# Patient Record
Sex: Female | Born: 1955 | Race: White | Hispanic: No | Marital: Married | Smoking: Former smoker
Health system: Southern US, Community
[De-identification: ages and names within clinical notes are randomized; demographics above are authoritative.]

## PROBLEM LIST (undated history)

## (undated) DIAGNOSIS — E785 Hyperlipidemia, unspecified: Secondary | ICD-10-CM

## (undated) DIAGNOSIS — M2550 Pain in unspecified joint: Secondary | ICD-10-CM

## (undated) DIAGNOSIS — D351 Benign neoplasm of parathyroid gland: Secondary | ICD-10-CM

## (undated) DIAGNOSIS — T7840XA Allergy, unspecified, initial encounter: Secondary | ICD-10-CM

## (undated) DIAGNOSIS — G4733 Obstructive sleep apnea (adult) (pediatric): Secondary | ICD-10-CM

## (undated) DIAGNOSIS — L509 Urticaria, unspecified: Secondary | ICD-10-CM

## (undated) DIAGNOSIS — E041 Nontoxic single thyroid nodule: Secondary | ICD-10-CM

## (undated) DIAGNOSIS — N179 Acute kidney failure, unspecified: Secondary | ICD-10-CM

## (undated) DIAGNOSIS — D649 Anemia, unspecified: Secondary | ICD-10-CM

## (undated) DIAGNOSIS — E213 Hyperparathyroidism, unspecified: Secondary | ICD-10-CM

## (undated) DIAGNOSIS — K635 Polyp of colon: Secondary | ICD-10-CM

## (undated) DIAGNOSIS — M8580 Other specified disorders of bone density and structure, unspecified site: Secondary | ICD-10-CM

## (undated) DIAGNOSIS — E559 Vitamin D deficiency, unspecified: Secondary | ICD-10-CM

## (undated) DIAGNOSIS — G500 Trigeminal neuralgia: Secondary | ICD-10-CM

## (undated) DIAGNOSIS — M1990 Unspecified osteoarthritis, unspecified site: Secondary | ICD-10-CM

## (undated) DIAGNOSIS — I10 Essential (primary) hypertension: Secondary | ICD-10-CM

## (undated) DIAGNOSIS — G5603 Carpal tunnel syndrome, bilateral upper limbs: Secondary | ICD-10-CM

## (undated) DIAGNOSIS — K219 Gastro-esophageal reflux disease without esophagitis: Secondary | ICD-10-CM

## (undated) DIAGNOSIS — N200 Calculus of kidney: Secondary | ICD-10-CM

## (undated) DIAGNOSIS — N1330 Unspecified hydronephrosis: Secondary | ICD-10-CM

## (undated) HISTORY — DX: Urticaria, unspecified: L50.9

## (undated) HISTORY — DX: Vitamin D deficiency, unspecified: E55.9

## (undated) HISTORY — DX: Polyp of colon: K63.5

## (undated) HISTORY — DX: Hyperparathyroidism, unspecified: E21.3

## (undated) HISTORY — DX: Pain in unspecified joint: M25.50

## (undated) HISTORY — DX: Calculus of kidney: N20.0

## (undated) HISTORY — DX: Allergy, unspecified, initial encounter: T78.40XA

## (undated) HISTORY — DX: Nontoxic single thyroid nodule: E04.1

## (undated) HISTORY — DX: Carpal tunnel syndrome, bilateral upper limbs: G56.03

## (undated) HISTORY — DX: Obstructive sleep apnea (adult) (pediatric): G47.33

## (undated) HISTORY — DX: Unspecified osteoarthritis, unspecified site: M19.90

## (undated) HISTORY — DX: Unspecified hydronephrosis: N13.30

## (undated) HISTORY — DX: Acute kidney failure, unspecified: N17.9

## (undated) HISTORY — DX: Gastro-esophageal reflux disease without esophagitis: K21.9

## (undated) HISTORY — DX: Trigeminal neuralgia: G50.0

## (undated) HISTORY — DX: Hyperlipidemia, unspecified: E78.5

## (undated) HISTORY — DX: Benign neoplasm of parathyroid gland: D35.1

## (undated) HISTORY — DX: Essential (primary) hypertension: I10

## (undated) HISTORY — DX: Other specified disorders of bone density and structure, unspecified site: M85.80

## (undated) HISTORY — DX: Anemia, unspecified: D64.9

## (undated) HISTORY — PX: PARATHYROIDECTOMY: SHX19

---

## 1998-11-18 HISTORY — PX: FACIAL NERVE SURGERY: SHX630

## 2002-03-18 ENCOUNTER — Other Ambulatory Visit: Admission: RE | Admit: 2002-03-18 | Discharge: 2002-03-18 | Payer: Self-pay | Admitting: Family Medicine

## 2004-02-03 ENCOUNTER — Other Ambulatory Visit: Admission: RE | Admit: 2004-02-03 | Discharge: 2004-02-03 | Payer: Self-pay | Admitting: Family Medicine

## 2013-06-29 ENCOUNTER — Encounter: Payer: Self-pay | Admitting: Family Medicine

## 2013-06-29 ENCOUNTER — Ambulatory Visit (INDEPENDENT_AMBULATORY_CARE_PROVIDER_SITE_OTHER): Payer: Self-pay | Admitting: Family Medicine

## 2013-06-29 VITALS — BP 121/82 | HR 88 | Ht 64.5 in | Wt 210.0 lb

## 2013-06-29 DIAGNOSIS — K219 Gastro-esophageal reflux disease without esophagitis: Secondary | ICD-10-CM

## 2013-06-29 DIAGNOSIS — D649 Anemia, unspecified: Secondary | ICD-10-CM

## 2013-06-29 DIAGNOSIS — E785 Hyperlipidemia, unspecified: Secondary | ICD-10-CM

## 2013-06-29 DIAGNOSIS — E876 Hypokalemia: Secondary | ICD-10-CM

## 2013-06-29 DIAGNOSIS — J309 Allergic rhinitis, unspecified: Secondary | ICD-10-CM

## 2013-06-29 DIAGNOSIS — R7301 Impaired fasting glucose: Secondary | ICD-10-CM

## 2013-06-29 DIAGNOSIS — I10 Essential (primary) hypertension: Secondary | ICD-10-CM

## 2013-06-29 DIAGNOSIS — L509 Urticaria, unspecified: Secondary | ICD-10-CM

## 2013-06-29 DIAGNOSIS — R5383 Other fatigue: Secondary | ICD-10-CM

## 2013-06-29 DIAGNOSIS — R5381 Other malaise: Secondary | ICD-10-CM

## 2013-06-29 MED ORDER — POTASSIUM CHLORIDE ER 10 MEQ PO TBCR: 10.0000 meq | EXTENDED_RELEASE_TABLET | Freq: Two times a day (BID) | ORAL | Status: DC

## 2013-06-29 MED ORDER — LOSARTAN POTASSIUM-HCTZ 100-25 MG PO TABS: 1.0000 | ORAL_TABLET | Freq: Every day | ORAL | Status: DC

## 2013-06-29 MED ORDER — PANTOPRAZOLE SODIUM 40 MG PO TBEC: 40.0000 mg | DELAYED_RELEASE_TABLET | Freq: Every day | ORAL | Status: DC

## 2013-06-29 NOTE — Progress Notes (Signed)
  Subjective:    Patient ID: Andrea Melton, female    DOB: Oct 27, 1956, 57 y.o.   MRN: [id number]  HPI  Andrea Melton is here today to establish care with our practice.  She previously received her care from Dr. Almedia Balls with [facility].  She did not feel confident in the care she was receiving and decided she wanted a new provider.  She has run out of her medications and needs refills.  She would like them to be sent to her mail order pharmacy with a 90 day supply.      Review of Systems  Constitutional: Negative.   HENT: Negative.   Eyes: Negative.   Respiratory: Negative.   Cardiovascular: Negative.   Gastrointestinal: Negative.   Endocrine: Negative.   Genitourinary: Negative.   Musculoskeletal: Negative.   Skin: Negative.   Allergic/Immunologic: Negative.   Neurological: Negative.   Hematological: Negative.   Psychiatric/Behavioral: Negative.     Past Medical History  Diagnosis Date  . Hypertension   . Allergy   . GERD (gastroesophageal reflux disease)   . Joint pain   . Trigeminal neuralgia     Family History  Problem Relation Age of Onset  . Hypertension Mother   . Stroke Mother   . Heart disease Father   . Hypertension Father   . Stroke Father   . Depression Maternal Grandmother   . Hypertension Brother   . Kidney disease Brother   . Cancer Sister   . Hypertension Sister   . Kidney disease Sister   . Cancer Sister   . 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    Alcohol Use:  Twice weekly (2 glasses of wine)    Drug Use:  None   Diet:  Regular   Exercise:  Cardio and  weights three times weekly   Hobbies:  Sewing, Reading and gardening                     Objective:   Physical Exam  Vitals reviewed. Constitutional: She appears well-nourished. No distress.  HENT:  Head: Normocephalic.  Eyes: No scleral icterus.  Neck: No thyromegaly present.  Cardiovascular: Normal rate, regular rhythm and normal heart sounds.   Pulmonary/Chest: Effort normal and breath sounds normal.  Abdominal: There is no tenderness.  Musculoskeletal: She exhibits no edema and no tenderness.  Neurological: She is alert.  Skin: Skin is warm and dry.  Psychiatric: She has a normal mood and affect. Her behavior is normal. Judgment and thought content normal.          Assessment & Plan:

## 2013-06-29 NOTE — Patient Instructions (Addendum)
Preventive Care for Adults, Female A healthy lifestyle and preventive care can promote health and wellness. Preventive health guidelines for women include the following key practices.  A routine yearly physical is a good way to check with your caregiver about your health and preventive screening. It is a chance to share any concerns and updates on your health, and to receive a thorough exam.  Visit your dentist for a routine exam and preventive care every 6 months. Brush your teeth twice a day and floss once a day. Good oral hygiene prevents tooth decay and gum disease.  The frequency of eye exams is based on your age, health, family medical history, use of contact lenses, and other factors. Follow your caregiver's recommendations for frequency of eye exams.  Eat a healthy diet. Foods like vegetables, fruits, whole grains, low-fat dairy products, and lean protein foods contain the nutrients you need without too many calories. Decrease your intake of foods high in solid fats, added sugars, and salt. Eat the right amount of calories for you.Get information about a proper diet from your caregiver, if necessary.  Regular physical exercise is one of the most important things you can do for your health. Most adults should get at least 150 minutes of moderate-intensity exercise (any activity that increases your heart rate and causes you to sweat) each week. In addition, most adults need muscle-strengthening exercises on 2 or more days a week.  Maintain a healthy weight. The body mass index (BMI) is a screening tool to identify possible weight problems. It provides an estimate of body fat based on height and weight. Your caregiver can help determine your BMI, and can help you achieve or maintain a healthy weight.For adults 20 years and older:  A BMI below 18.5 is considered underweight.  A BMI of 18.5 to 24.9 is normal.  A BMI of 25 to 29.9 is considered overweight.  A BMI of 30 and above is  considered obese.  Maintain normal blood lipids and cholesterol levels by exercising and minimizing your intake of saturated fat. Eat a balanced diet with plenty of fruit and vegetables. Blood tests for lipids and cholesterol should begin at age 20 and be repeated every 5 years. If your lipid or cholesterol levels are high, you are over 50, or you are at high risk for heart disease, you may need your cholesterol levels checked more frequently.Ongoing high lipid and cholesterol levels should be treated with medicines if diet and exercise are not effective.  If you smoke, find out from your caregiver how to quit. If you do not use tobacco, do not start.  If you are pregnant, do not drink alcohol. If you are breastfeeding, be very cautious about drinking alcohol. If you are not pregnant and choose to drink alcohol, do not exceed 1 drink per day. One drink is considered to be 12 ounces (355 mL) of beer, 5 ounces (148 mL) of wine, or 1.5 ounces (44 mL) of liquor.  Avoid use of street drugs. Do not share needles with anyone. Ask for help if you need support or instructions about stopping the use of drugs.  High blood pressure causes heart disease and increases the risk of stroke. Your blood pressure should be checked at least every 1 to 2 years. Ongoing high blood pressure should be treated with medicines if weight loss and exercise are not effective.  If you are 55 to 59 years old, ask your caregiver if you should take aspirin to prevent strokes.  Diabetes   screening involves taking a blood sample to check your fasting blood sugar level. This should be done once every 3 years, after age 45, if you are within normal weight and without risk factors for diabetes. Testing should be considered at a younger age or be carried out more frequently if you are overweight and have at least 1 risk factor for diabetes.  Breast cancer screening is essential preventive care for women. You should practice "breast  self-awareness." This means understanding the normal appearance and feel of your breasts and may include breast self-examination. Any changes detected, no matter how small, should be reported to a caregiver. Women in their 20s and 30s should have a clinical breast exam (CBE) by a caregiver as part of a regular health exam every 1 to 3 years. After age 40, women should have a CBE every year. Starting at age 40, women should consider having a mammography (breast X-ray test) every year. Women who have a family history of breast cancer should talk to their caregiver about genetic screening. Women at a high risk of breast cancer should talk to their caregivers about having magnetic resonance imaging (MRI) and a mammography every year.  The Pap test is a screening test for cervical cancer. A Pap test can show cell changes on the cervix that might become cervical cancer if left untreated. A Pap test is a procedure in which cells are obtained and examined from the lower end of the uterus (cervix).  Women should have a Pap test starting at age 21.  Between ages 21 and 29, Pap tests should be repeated every 2 years.  Beginning at age 30, you should have a Pap test every 3 years as long as the past 3 Pap tests have been normal.  Some women have medical problems that increase the chance of getting cervical cancer. Talk to your caregiver about these problems. It is especially important to talk to your caregiver if a new problem develops soon after your last Pap test. In these cases, your caregiver may recommend more frequent screening and Pap tests.  The above recommendations are the same for women who have or have not gotten the vaccine for human papillomavirus (HPV).  If you had a hysterectomy for a problem that was not cancer or a condition that could lead to cancer, then you no longer need Pap tests. Even if you no longer need a Pap test, a regular exam is a good idea to make sure no other problems are  starting.  If you are between ages 65 and 70, and you have had normal Pap tests going back 10 years, you no longer need Pap tests. Even if you no longer need a Pap test, a regular exam is a good idea to make sure no other problems are starting.  If you have had past treatment for cervical cancer or a condition that could lead to cancer, you need Pap tests and screening for cancer for at least 20 years after your treatment.  If Pap tests have been discontinued, risk factors (such as a new sexual partner) need to be reassessed to determine if screening should be resumed.  The HPV test is an additional test that may be used for cervical cancer screening. The HPV test looks for the virus that can cause the cell changes on the cervix. The cells collected during the Pap test can be tested for HPV. The HPV test could be used to screen women aged 30 years and older, and should   be used in women of any age who have unclear Pap test results. After the age of 30, women should have HPV testing at the same frequency as a Pap test.  Colorectal cancer can be detected and often prevented. Most routine colorectal cancer screening begins at the age of 50 and continues through age 75. However, your caregiver may recommend screening at an earlier age if you have risk factors for colon cancer. On a yearly basis, your caregiver may provide home test kits to check for hidden blood in the stool. Use of a small camera at the end of a tube, to directly examine the colon (sigmoidoscopy or colonoscopy), can detect the earliest forms of colorectal cancer. Talk to your caregiver about this at age 50, when routine screening begins. Direct examination of the colon should be repeated every 5 to 10 years through age 75, unless early forms of pre-cancerous polyps or small growths are found.  Hepatitis C blood testing is recommended for all people born from 1945 through 1965 and any individual with known risks for hepatitis C.  Practice  safe sex. Use condoms and avoid high-risk sexual practices to reduce the spread of sexually transmitted infections (STIs). STIs include gonorrhea, chlamydia, syphilis, trichomonas, herpes, HPV, and human immunodeficiency virus (HIV). Herpes, HIV, and HPV are viral illnesses that have no cure. They can result in disability, cancer, and death. Sexually active women aged 25 and younger should be checked for chlamydia. Older women with new or multiple partners should also be tested for chlamydia. Testing for other STIs is recommended if you are sexually active and at increased risk.  Osteoporosis is a disease in which the bones lose minerals and strength with aging. This can result in serious bone fractures. The risk of osteoporosis can be identified using a bone density scan. Women ages 65 and over and women at risk for fractures or osteoporosis should discuss screening with their caregivers. Ask your caregiver whether you should take a calcium supplement or vitamin D to reduce the rate of osteoporosis.  Menopause can be associated with physical symptoms and risks. Hormone replacement therapy is available to decrease symptoms and risks. You should talk to your caregiver about whether hormone replacement therapy is right for you.  Use sunscreen with sun protection factor (SPF) of 30 or more. Apply sunscreen liberally and repeatedly throughout the day. You should seek shade when your shadow is shorter than you. Protect yourself by wearing long sleeves, pants, a wide-brimmed hat, and sunglasses year round, whenever you are outdoors.  Once a month, do a whole body skin exam, using a mirror to look at the skin on your back. Notify your caregiver of new moles, moles that have irregular borders, moles that are larger than a pencil eraser, or moles that have changed in shape or color.  Stay current with required immunizations.  Influenza. You need a dose every fall (or winter). The composition of the flu vaccine  changes each year, so being vaccinated once is not enough.  Pneumococcal polysaccharide. You need 1 to 2 doses if you smoke cigarettes or if you have certain chronic medical conditions. You need 1 dose at age 65 (or older) if you have never been vaccinated.  Tetanus, diphtheria, pertussis (Tdap, Td). Get 1 dose of Tdap vaccine if you are younger than age 65, are over 65 and have contact with an infant, are a healthcare worker, are pregnant, or simply want to be protected from whooping cough. After that, you need a Td   booster dose every 10 years. Consult your caregiver if you have not had at least 3 tetanus and diphtheria-containing shots sometime in your life or have a deep or dirty wound.  HPV. You need this vaccine if you are a woman age 26 or younger. The vaccine is given in 3 doses over 6 months.  Measles, mumps, rubella (MMR). You need at least 1 dose of MMR if you were born in 1957 or later. You may also need a second dose.  Meningococcal. If you are age 19 to 21 and a first-year college student living in a residence hall, or have one of several medical conditions, you need to get vaccinated against meningococcal disease. You may also need additional booster doses.  Zoster (shingles). If you are age 60 or older, you should get this vaccine.  Varicella (chickenpox). If you have never had chickenpox or you were vaccinated but received only 1 dose, talk to your caregiver to find out if you need this vaccine.  Hepatitis A. You need this vaccine if you have a specific risk factor for hepatitis A virus infection or you simply wish to be protected from this disease. The vaccine is usually given as 2 doses, 6 to 18 months apart.  Hepatitis B. You need this vaccine if you have a specific risk factor for hepatitis B virus infection or you simply wish to be protected from this disease. The vaccine is given in 3 doses, usually over 6 months. Preventive Services / Frequency Ages 19 to 39  Blood  pressure check.** / Every 1 to 2 years.  Lipid and cholesterol check.** / Every 5 years beginning at age 20.  Clinical breast exam.** / Every 3 years for women in their 20s and 30s.  Pap test.** / Every 2 years from ages 21 through 29. Every 3 years starting at age 30 through age 65 or 70 with a history of 3 consecutive normal Pap tests.  HPV screening.** / Every 3 years from ages 30 through ages 65 to 70 with a history of 3 consecutive normal Pap tests.  Hepatitis C blood test.** / For any individual with known risks for hepatitis C.  Skin self-exam. / Monthly.  Influenza immunization.** / Every year.  Pneumococcal polysaccharide immunization.** / 1 to 2 doses if you smoke cigarettes or if you have certain chronic medical conditions.  Tetanus, diphtheria, pertussis (Tdap, Td) immunization. / A one-time dose of Tdap vaccine. After that, you need a Td booster dose every 10 years.  HPV immunization. / 3 doses over 6 months, if you are 26 and younger.  Measles, mumps, rubella (MMR) immunization. / You need at least 1 dose of MMR if you were born in 1957 or later. You may also need a second dose.  Meningococcal immunization. / 1 dose if you are age 19 to 21 and a first-year college student living in a residence hall, or have one of several medical conditions, you need to get vaccinated against meningococcal disease. You may also need additional booster doses.  Varicella immunization.** / Consult your caregiver.  Hepatitis A immunization.** / Consult your caregiver. 2 doses, 6 to 18 months apart.  Hepatitis B immunization.** / Consult your caregiver. 3 doses usually over 6 months. Ages 40 to 64  Blood pressure check.** / Every 1 to 2 years.  Lipid and cholesterol check.** / Every 5 years beginning at age 20.  Clinical breast exam.** / Every year after age 40.  Mammogram.** / Every year beginning at age 40   and continuing for as long as you are in good health. Consult with your  caregiver.  Pap test.** / Every 3 years starting at age 30 through age 65 or 70 with a history of 3 consecutive normal Pap tests.  HPV screening.** / Every 3 years from ages 30 through ages 65 to 70 with a history of 3 consecutive normal Pap tests.  Fecal occult blood test (FOBT) of stool. / Every year beginning at age 50 and continuing until age 75. You may not need to do this test if you get a colonoscopy every 10 years.  Flexible sigmoidoscopy or colonoscopy.** / Every 5 years for a flexible sigmoidoscopy or every 10 years for a colonoscopy beginning at age 50 and continuing until age 75.  Hepatitis C blood test.** / For all people born from 1945 through 1965 and any individual with known risks for hepatitis C.  Skin self-exam. / Monthly.  Influenza immunization.** / Every year.  Pneumococcal polysaccharide immunization.** / 1 to 2 doses if you smoke cigarettes or if you have certain chronic medical conditions.  Tetanus, diphtheria, pertussis (Tdap, Td) immunization.** / A one-time dose of Tdap vaccine. After that, you need a Td booster dose every 10 years.  Measles, mumps, rubella (MMR) immunization. / You need at least 1 dose of MMR if you were born in 1957 or later. You may also need a second dose.  Varicella immunization.** / Consult your caregiver.  Meningococcal immunization.** / Consult your caregiver.  Hepatitis A immunization.** / Consult your caregiver. 2 doses, 6 to 18 months apart.  Hepatitis B immunization.** / Consult your caregiver. 3 doses, usually over 6 months. Ages 65 and over  Blood pressure check.** / Every 1 to 2 years.  Lipid and cholesterol check.** / Every 5 years beginning at age 20.  Clinical breast exam.** / Every year after age 40.  Mammogram.** / Every year beginning at age 40 and continuing for as long as you are in good health. Consult with your caregiver.  Pap test.** / Every 3 years starting at age 30 through age 65 or 70 with a 3  consecutive normal Pap tests. Testing can be stopped between 65 and 70 with 3 consecutive normal Pap tests and no abnormal Pap or HPV tests in the past 10 years.  HPV screening.** / Every 3 years from ages 30 through ages 65 or 70 with a history of 3 consecutive normal Pap tests. Testing can be stopped between 65 and 70 with 3 consecutive normal Pap tests and no abnormal Pap or HPV tests in the past 10 years.  Fecal occult blood test (FOBT) of stool. / Every year beginning at age 50 and continuing until age 75. You may not need to do this test if you get a colonoscopy every 10 years.  Flexible sigmoidoscopy or colonoscopy.** / Every 5 years for a flexible sigmoidoscopy or every 10 years for a colonoscopy beginning at age 50 and continuing until age 75.  Hepatitis C blood test.** / For all people born from 1945 through 1965 and any individual with known risks for hepatitis C.  Osteoporosis screening.** / A one-time screening for women ages 65 and over and women at risk for fractures or osteoporosis.  Skin self-exam. / Monthly.  Influenza immunization.** / Every year.  Pneumococcal polysaccharide immunization.** / 1 dose at age 65 (or older) if you have never been vaccinated.  Tetanus, diphtheria, pertussis (Tdap, Td) immunization. / A one-time dose of Tdap vaccine if you are over   65 and have contact with an infant, are a healthcare worker, or simply want to be protected from whooping cough. After that, you need a Td booster dose every 10 years.  Varicella immunization.** / Consult your caregiver.  Meningococcal immunization.** / Consult your caregiver.  Hepatitis A immunization.** / Consult your caregiver. 2 doses, 6 to 18 months apart.  Hepatitis B immunization.** / Check with your caregiver. 3 doses, usually over 6 months. ** Family history and personal history of risk and conditions may change your caregiver's recommendations. Document Released: 12/31/2001 Document Revised: 01/27/2012  Document Reviewed: 04/01/2011 [employer] Patient Information 2014 [employer].  

## 2013-07-07 LAB — CBC WITH DIFFERENTIAL/PLATELET
Basophils Absolute: 0 10*3/uL (ref 0.0–0.1)
Basophils Relative: 1 % (ref 0–1)
Eosinophils Absolute: 0.1 10*3/uL (ref 0.0–0.7)
Eosinophils Relative: 3 % (ref 0–5)
HCT: 37.4 % (ref 36.0–46.0)
Hemoglobin: 12.8 g/dL (ref 12.0–15.0)
Lymphocytes Relative: 29 % (ref 12–46)
Lymphs Abs: 1.5 10*3/uL (ref 0.7–4.0)
MCH: 27.7 pg (ref 26.0–34.0)
MCHC: 34.2 g/dL (ref 30.0–36.0)
MCV: 81 fL (ref 78.0–100.0)
Monocytes Absolute: 0.3 10*3/uL (ref 0.1–1.0)
Monocytes Relative: 7 % (ref 3–12)
Neutro Abs: 3 10*3/uL (ref 1.7–7.7)
Neutrophils Relative %: 60 % (ref 43–77)
Platelets: 284 10*3/uL (ref 150–400)
RBC: 4.62 MIL/uL (ref 3.87–5.11)
RDW: 13.7 % (ref 11.5–15.5)
WBC: 5 10*3/uL (ref 4.0–10.5)

## 2013-07-07 LAB — LIPID PANEL
Cholesterol: 226 mg/dL — ABNORMAL HIGH (ref 0–200)
HDL: 57 mg/dL (ref >39)
LDL Cholesterol: 152 mg/dL — ABNORMAL HIGH (ref 0–99)
Total CHOL/HDL Ratio: 4 Ratio
Triglycerides: 86 mg/dL (ref <150)
VLDL: 17 mg/dL (ref 0–40)

## 2013-07-07 LAB — COMPLETE METABOLIC PANEL WITH GFR
ALT: 35 U/L (ref 0–35)
AST: 23 U/L (ref 0–37)
Albumin: 4.1 g/dL (ref 3.5–5.2)
Alkaline Phosphatase: 76 U/L (ref 39–117)
BUN: 19 mg/dL (ref 6–23)
CO2: 24 mEq/L (ref 19–32)
Calcium: 10.1 mg/dL (ref 8.4–10.5)
Chloride: 103 mEq/L (ref 96–112)
Creat: 0.89 mg/dL (ref 0.50–1.10)
GFR, Est African American: 82 mL/min
GFR, Est Non African American: 71 mL/min
Glucose, Bld: 113 mg/dL — ABNORMAL HIGH (ref 70–99)
Potassium: 3.6 mEq/L (ref 3.5–5.3)
Sodium: 137 mEq/L (ref 135–145)
Total Bilirubin: 0.9 mg/dL (ref 0.3–1.2)
Total Protein: 6.8 g/dL (ref 6.0–8.3)

## 2013-07-07 LAB — TSH: TSH: 2.667 u[IU]/mL (ref 0.350–4.500)

## 2013-07-08 LAB — INSULIN, RANDOM: Insulin: 27 u[IU]/mL (ref 3–28)

## 2013-07-09 LAB — INSULIN, FASTING: Insulin fasting, serum: 26 u[IU]/mL (ref 3–28)

## 2013-07-11 ENCOUNTER — Encounter: Payer: Self-pay | Admitting: Family Medicine

## 2013-07-11 DIAGNOSIS — J309 Allergic rhinitis, unspecified: Secondary | ICD-10-CM | POA: Insufficient documentation

## 2013-07-11 DIAGNOSIS — E876 Hypokalemia: Secondary | ICD-10-CM | POA: Insufficient documentation

## 2013-07-11 DIAGNOSIS — K2270 Barrett's esophagus without dysplasia: Secondary | ICD-10-CM | POA: Insufficient documentation

## 2013-07-11 DIAGNOSIS — K219 Gastro-esophageal reflux disease without esophagitis: Secondary | ICD-10-CM | POA: Insufficient documentation

## 2013-07-11 DIAGNOSIS — E785 Hyperlipidemia, unspecified: Secondary | ICD-10-CM | POA: Insufficient documentation

## 2013-07-11 DIAGNOSIS — I10 Essential (primary) hypertension: Secondary | ICD-10-CM | POA: Insufficient documentation

## 2013-07-11 DIAGNOSIS — R7301 Impaired fasting glucose: Secondary | ICD-10-CM | POA: Insufficient documentation

## 2013-07-11 DIAGNOSIS — L509 Urticaria, unspecified: Secondary | ICD-10-CM | POA: Insufficient documentation

## 2013-07-11 DIAGNOSIS — R5381 Other malaise: Secondary | ICD-10-CM | POA: Insufficient documentation

## 2013-07-11 DIAGNOSIS — D649 Anemia, unspecified: Secondary | ICD-10-CM | POA: Insufficient documentation

## 2013-07-11 NOTE — Assessment & Plan Note (Signed)
Refilled her potassium.   

## 2013-07-11 NOTE — Assessment & Plan Note (Signed)
Checking a fasting insulin level.

## 2013-07-11 NOTE — Assessment & Plan Note (Signed)
Checking a lipid panel.  

## 2013-07-11 NOTE — Assessment & Plan Note (Signed)
This condition is controlled with Periactin.

## 2013-07-11 NOTE — Assessment & Plan Note (Signed)
She will the compare the effectiveness vs cost of omeprazole.

## 2013-07-11 NOTE — Assessment & Plan Note (Signed)
Checking a TSH.   

## 2013-07-11 NOTE — Assessment & Plan Note (Addendum)
Her symptoms are usually controlled with Periactin so this was refilled.

## 2013-07-11 NOTE — Assessment & Plan Note (Signed)
Checking a CBC 

## 2013-07-11 NOTE — Assessment & Plan Note (Signed)
Refilled her losartan/HCT at her current dosage.

## 2013-08-03 ENCOUNTER — Encounter: Payer: Self-pay | Admitting: Family Medicine

## 2013-08-03 ENCOUNTER — Ambulatory Visit (INDEPENDENT_AMBULATORY_CARE_PROVIDER_SITE_OTHER): Payer: BC Managed Care – PPO | Admitting: Family Medicine

## 2013-08-03 ENCOUNTER — Other Ambulatory Visit (HOSPITAL_COMMUNITY)
Admission: RE | Admit: 2013-08-03 | Discharge: 2013-08-03 | Disposition: A | Discharge status: Home / Self Care | Payer: BC Managed Care – PPO | Source: Ambulatory Visit | Attending: Family Medicine | Admitting: Family Medicine

## 2013-08-03 VITALS — BP 130/87 | HR 93 | Ht 64.5 in | Wt 207.0 lb

## 2013-08-03 DIAGNOSIS — L509 Urticaria, unspecified: Secondary | ICD-10-CM

## 2013-08-03 DIAGNOSIS — Z0000 Encounter for general adult medical examination without abnormal findings: Secondary | ICD-10-CM

## 2013-08-03 DIAGNOSIS — N390 Urinary tract infection, site not specified: Secondary | ICD-10-CM

## 2013-08-03 DIAGNOSIS — Z124 Encounter for screening for malignant neoplasm of cervix: Secondary | ICD-10-CM

## 2013-08-03 DIAGNOSIS — G500 Trigeminal neuralgia: Secondary | ICD-10-CM

## 2013-08-03 DIAGNOSIS — R7301 Impaired fasting glucose: Secondary | ICD-10-CM

## 2013-08-03 DIAGNOSIS — Z1151 Encounter for screening for human papillomavirus (HPV): Secondary | ICD-10-CM | POA: Insufficient documentation

## 2013-08-03 DIAGNOSIS — R8290 Unspecified abnormal findings in urine: Secondary | ICD-10-CM

## 2013-08-03 DIAGNOSIS — E785 Hyperlipidemia, unspecified: Secondary | ICD-10-CM

## 2013-08-03 DIAGNOSIS — M25559 Pain in unspecified hip: Secondary | ICD-10-CM

## 2013-08-03 DIAGNOSIS — Z23 Encounter for immunization: Secondary | ICD-10-CM

## 2013-08-03 DIAGNOSIS — Z01419 Encounter for gynecological examination (general) (routine) without abnormal findings: Secondary | ICD-10-CM | POA: Insufficient documentation

## 2013-08-03 DIAGNOSIS — K219 Gastro-esophageal reflux disease without esophagitis: Secondary | ICD-10-CM

## 2013-08-03 DIAGNOSIS — R82998 Other abnormal findings in urine: Secondary | ICD-10-CM

## 2013-08-03 LAB — POCT URINALYSIS DIPSTICK
Bilirubin, UA: NEGATIVE
Blood, UA: NEGATIVE
Glucose, UA: NEGATIVE
Ketones, UA: NEGATIVE
Leukocytes, UA: NEGATIVE
Nitrite, UA: POSITIVE
Protein, UA: NEGATIVE
Spec Grav, UA: 1.02
Urobilinogen, UA: NEGATIVE
pH, UA: 7

## 2013-08-03 MED ORDER — PREGABALIN 75 MG PO CAPS: 75.0000 mg | ORAL_CAPSULE | Freq: Two times a day (BID) | ORAL | Status: DC

## 2013-08-03 MED ORDER — PRAVASTATIN SODIUM 20 MG PO TABS: 20.0000 mg | ORAL_TABLET | Freq: Every evening | ORAL | Status: DC

## 2013-08-03 MED ORDER — RANITIDINE HCL 150 MG PO TABS: ORAL_TABLET | ORAL | Status: DC

## 2013-08-03 NOTE — Progress Notes (Signed)
Subjective:    Patient ID: Andrea Melton, female    DOB: 1955/12/29, 57 y.o.   MRN: [id number]  HPI  Andrea Melton is here today for her annual CPE/PAP.  She also wants to discuss the conditions listed below:      1)  Hives: She woke up with another hives episode this morning.  She has not taken her Periactin today to avoid drowsiness.   2)  Neuralgia:  She has been experiencing a mild "neuralgia" pain in her head.  She has a similar pain about 14 years ago.      Review of Systems  Constitutional: Negative.   HENT: Negative.   Eyes: Negative.   Respiratory: Negative.   Cardiovascular: Negative.   Gastrointestinal: Negative.   Endocrine: Negative.   Genitourinary: Negative.   Musculoskeletal: Positive for arthralgias (Hip Pain ).  Skin: Positive for rash.  Allergic/Immunologic: Negative.   Neurological:       Pain in head (Trigeminal)   Hematological: Negative.   Psychiatric/Behavioral: Negative.      Past Medical History  Diagnosis Date  . Hypertension   . Allergy   . GERD (gastroesophageal reflux disease)   . Joint pain   . Trigeminal neuralgia   . Urticaria      Family History  Problem Relation Age of Onset  . Hypertension Mother   . Stroke Mother   . Heart disease Father   . Hypertension Father   . Stroke Father   . Depression Maternal Grandmother   . Hypertension Brother   . Kidney disease Brother   . Cancer Sister   . Hypertension Sister   . Kidney disease Sister   . Cancer Sister   . 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    Alcohol Use:  Twice weekly (2 glasses of wine)     Drug Use:  None   Diet:  Regular   Exercise:  Cardio and weights three times weekly   Hobbies:  Sewing, Reading and gardening                    Objective:   Physical Exam  Vitals reviewed. Constitutional: She is oriented to person, place, and time. She appears well-developed and well-nourished.  HENT:  Head: Normocephalic and atraumatic.  Right Ear: External ear normal.  Left Ear: External ear normal.  Nose: Nose normal.  Mouth/Throat: Oropharynx is clear and moist.  Eyes: Conjunctivae and EOM are normal. Pupils are equal, round, and reactive to light.  Neck: Normal range of motion. No thyromegaly present.  Cardiovascular: Normal rate, regular rhythm, normal heart sounds and intact distal pulses.  Exam reveals no gallop and no friction rub.   No murmur heard. Pulmonary/Chest: Effort normal and breath sounds normal. Right breast exhibits no inverted nipple, no mass, no nipple discharge, no skin change and no tenderness. Left breast exhibits no inverted nipple, no mass, no nipple discharge, no skin change and no tenderness. Breasts are symmetrical.  Abdominal: Soft. Bowel sounds are normal. Hernia confirmed negative in the right inguinal area and  confirmed negative in the left inguinal area.  Genitourinary: Vagina normal and uterus normal. Pelvic exam was performed with patient supine. There is no rash, tenderness or lesion on the right labia. There is no rash, tenderness or lesion on the left labia. No vaginal discharge found.  Musculoskeletal: Normal range of motion. She exhibits no edema and no tenderness.  Lymphadenopathy:    She has no cervical adenopathy.       Right: No inguinal adenopathy present.       Left: No inguinal adenopathy present.  Neurological: She is alert and oriented to person, place, and time. She has normal reflexes.  Skin: Skin is warm and dry.  Psychiatric: She has a normal mood and affect. Her behavior is normal. Judgment and thought content normal.       Assessment & Plan:

## 2013-08-03 NOTE — Patient Instructions (Addendum)
1)  Preventative Care - Vaccinations (Tdap/Zostavax); Mammogram; Bone Density, Stool Cards   2)  Blood Sugar - Decrease carbs and increase exercise.   3)  Cholesterol - Consider taking the pravastatin 40 mg at night.    4)  Stroke Prevention - Baby ASA, Diet high in foods with Folic Acid; BP good control and Statin   5)  GERD/Stomach - Add 150 mg - 300 mg of ranitidine plus Mylanta plus 1 tsp Baking Soda in 8 oz water and Tums   6)  Hip/Knee Pain - ? X-ray ([facility] vs [facility])   7)  Pain - Lyrica 75 mg at night - you can go to BID.     Insulin Resistance Blood sugar (glucose) levels are controlled by a hormone called insulin. Insulin is made by your pancreas. When your blood glucose goes up, insulin is released into your blood. Insulin is required for your body to function normally. However, your body can become resistant to your own insulin or to insulin given to treat diabetes. In either case, insulin resistance can lead to serious problems. These problems include:  Type 2 diabetes.  Heart disease.  High blood pressure.  Stroke.  Polycystic ovary syndrome.  Fatty liver. CAUSES  Insulin resistance can develop for many different reasons. It is more likely to happen in people with these conditions or characteristics:  Obesity.  Inactivity.  Pregnancy.  High blood pressure.  Stress.  Steroid use.  Infection or severe illness.  Increased levels of cholesterol and triglycerides. SYMPTOMS  There are no symptoms. You may have symptoms related to the various complications of insulin resistance.  DIAGNOSIS  Several different things can make your caregiver suspect you have insulin resistance. These include:  High blood glucose (hyperglycemia).  Abnormal cholesterol levels.  High uric acid levels.  Changes related to blood pressure.  Changes related to inflammation. Insulin resistance can be determined with blood tests. An elevated insulin level when you have not  eaten might suggest resistance. Other more complicated tests are sometimes necessary. TREATMENT  Lifestyle changes are the most important treatment for insulin resistance.   If you are overweight and you have insulin resistance, you can improve your insulin sensitivity by losing weight.  Moderate exercise for 30 40 minutes, 4 days a week, can improve insulin sensitivity. Some medicines can also help improve your insulin sensitivity. Your caregiver can discuss these with you if they are appropriate.  HOME CARE INSTRUCTIONS   Do not smoke.  Keep your weight at a healthy level.  Get exercise.  If you have diabetes, follow your caregiver's directions.  If you have high blood pressure, follow your caregiver's directions.  Only take prescription medicines for pain, fever, or discomfort as directed by your caregiver. SEEK MEDICAL CARE IF:   You are diabetic and you are having problems keeping your blood glucose levels at target range.  You are having episodes of low blood glucose (hypoglycemia).  You feel you might be having side effects from your medicines.  You have symptoms of an illness that is not improving after 3 4 days.  You have a sore or wound that is not healing.  You notice a change in vision or a new problem with your vision. SEEK IMMEDIATE MEDICAL CARE IF:   Your blood glucose goes below 70, especially if you have confusion, lightheadedness, or other symptoms with it.  Your blood glucose is very high (as advised by your caregiver) twice in a row.  You pass out.  You  have chest pain or trouble breathing.  You have a sudden, severe headache.  You have sudden weakness in one arm or one leg.  You have sudden difficulty speaking or swallowing.  You develop vomiting or diarrhea that is getting worse or not improving after 1 day. Document Released: 12/24/2005 Document Revised: 05/05/2012 Document Reviewed: 12/22/2008 [facility] Patient Information 2014 [place],  [place].

## 2013-08-06 ENCOUNTER — Telehealth: Payer: Self-pay | Admitting: *Deleted

## 2013-08-06 LAB — URINE CULTURE: Colony Count: >=100000

## 2013-08-06 MED ORDER — CIPROFLOXACIN HCL 250 MG PO TABS: 250.0000 mg | ORAL_TABLET | Freq: Two times a day (BID) | ORAL | Status: AC

## 2013-08-06 NOTE — Telephone Encounter (Signed)
Andrea Melton is aware of her urine culture grew E. Coli.  She was instructed to take a course of Cipro. She was also instructed to contact us if her symptoms worsen.  PG

## 2013-08-12 ENCOUNTER — Ambulatory Visit (INDEPENDENT_AMBULATORY_CARE_PROVIDER_SITE_OTHER): Payer: BC Managed Care – PPO | Admitting: Family Medicine

## 2013-08-12 DIAGNOSIS — Z1210 Encounter for screening for malignant neoplasm of intestinal tract, unspecified: Secondary | ICD-10-CM

## 2013-08-12 LAB — HEMOCCULT GUIAC POC 1CARD (OFFICE)
Card #2 Fecal Occult Blod, POC: NEGATIVE
Card #3 Fecal Occult Blood, POC: NEGATIVE
Fecal Occult Blood, POC: NEGATIVE

## 2013-09-06 ENCOUNTER — Encounter: Payer: Self-pay | Admitting: Family Medicine

## 2013-09-08 ENCOUNTER — Encounter: Payer: Self-pay | Admitting: Family Medicine

## 2013-09-13 ENCOUNTER — Other Ambulatory Visit: Payer: Self-pay | Admitting: *Deleted

## 2013-09-13 ENCOUNTER — Encounter: Payer: Self-pay | Admitting: *Deleted

## 2013-09-13 DIAGNOSIS — Z1239 Encounter for other screening for malignant neoplasm of breast: Secondary | ICD-10-CM

## 2013-10-16 ENCOUNTER — Other Ambulatory Visit: Payer: Self-pay | Admitting: Family Medicine

## 2013-10-16 DIAGNOSIS — Z124 Encounter for screening for malignant neoplasm of cervix: Secondary | ICD-10-CM | POA: Insufficient documentation

## 2013-10-16 DIAGNOSIS — M25559 Pain in unspecified hip: Secondary | ICD-10-CM | POA: Insufficient documentation

## 2013-10-16 DIAGNOSIS — G500 Trigeminal neuralgia: Secondary | ICD-10-CM

## 2013-10-16 DIAGNOSIS — R8290 Unspecified abnormal findings in urine: Secondary | ICD-10-CM | POA: Insufficient documentation

## 2013-10-16 DIAGNOSIS — Z0000 Encounter for general adult medical examination without abnormal findings: Secondary | ICD-10-CM | POA: Insufficient documentation

## 2013-10-16 DIAGNOSIS — Z23 Encounter for immunization: Secondary | ICD-10-CM | POA: Insufficient documentation

## 2013-10-16 DIAGNOSIS — N390 Urinary tract infection, site not specified: Secondary | ICD-10-CM | POA: Insufficient documentation

## 2013-10-16 MED ORDER — NORTRIPTYLINE HCL 25 MG PO CAPS: 25.0000 mg | ORAL_CAPSULE | Freq: Every day | ORAL | Status: DC

## 2013-10-16 NOTE — Assessment & Plan Note (Signed)
A pap was done without difficulty.  We're checking for high risk HPV with reflex to 16/18 if positive.   

## 2013-10-16 NOTE — Assessment & Plan Note (Signed)
She is to add some Allegra in the am if needed.

## 2013-10-16 NOTE — Assessment & Plan Note (Signed)
She was given a prescription for ranitidine to help with not only her GERD but her hives as well.

## 2013-10-16 NOTE — Assessment & Plan Note (Signed)
Her LDL is high so she will start on pravastatin.

## 2013-10-16 NOTE — Assessment & Plan Note (Addendum)
Normal exam; we discussed preventative issues for the patient's sex and age.  She is to get her mammogram and check into vaccinations.  We discussed her getting a colonoscopy.  She would prefer to delay this for a little while.  She'll start with stool cards.

## 2013-10-16 NOTE — Assessment & Plan Note (Signed)
We discussed her hip pain and getting an x-ray.  She will consider getting it here [facility] vs going to an [facility]opedist.

## 2013-10-16 NOTE — Assessment & Plan Note (Signed)
She is to try some Lyrica for her Trigeminal Neuralgia.

## 2013-10-16 NOTE — Assessment & Plan Note (Signed)
Her FBS was high and her insulin level is on the high end of normal.  She is to work harder on diet and exercise.

## 2013-10-16 NOTE — Assessment & Plan Note (Signed)
Her urine grew >100,000 colonies of E. Coli.  She was treated with Cipro.

## 2013-10-16 NOTE — Assessment & Plan Note (Signed)
U/A showed nitrates so a culture was sent to [facility].

## 2013-10-16 NOTE — Assessment & Plan Note (Signed)
The patient confirmed that they are not allergic to eggs and have never had a bad reaction with the flu shot in the past.  The vaccination was given without difficulty.   

## 2013-11-03 ENCOUNTER — Encounter: Payer: Self-pay | Admitting: Family Medicine

## 2013-11-03 ENCOUNTER — Ambulatory Visit (INDEPENDENT_AMBULATORY_CARE_PROVIDER_SITE_OTHER): Payer: BC Managed Care – PPO | Admitting: Family Medicine

## 2013-11-03 ENCOUNTER — Ambulatory Visit: Payer: BC Managed Care – PPO | Admitting: Family Medicine

## 2013-11-03 VITALS — BP 126/86 | HR 84 | Resp 16 | Wt 207.0 lb

## 2013-11-03 DIAGNOSIS — G500 Trigeminal neuralgia: Secondary | ICD-10-CM

## 2013-11-03 DIAGNOSIS — E785 Hyperlipidemia, unspecified: Secondary | ICD-10-CM

## 2013-11-03 DIAGNOSIS — R7301 Impaired fasting glucose: Secondary | ICD-10-CM

## 2013-11-03 DIAGNOSIS — Z23 Encounter for immunization: Secondary | ICD-10-CM

## 2013-11-03 MED ORDER — PREGABALIN 75 MG PO CAPS: 75.0000 mg | ORAL_CAPSULE | Freq: Three times a day (TID) | ORAL | Status: DC

## 2013-11-03 MED ORDER — PRAVASTATIN SODIUM 20 MG PO TABS: 20.0000 mg | ORAL_TABLET | Freq: Every evening | ORAL | Status: DC

## 2013-11-03 NOTE — Progress Notes (Signed)
Subjective:    Patient ID: Andrea Melton, female    DOB: 01-[name]-1956, 57 y.o.   MRN: [id number]  HPI  Andrea Melton is here today to discuss the conditions listed below.   1)  Hip Pain:  She continues to have hip pain but says that it has improved since she has started exercising.  She has been taking Ibuprofen as needed for pain.  She is not interested in seeing an orthopaedic unless her pain worsens.    2)  Neuralgia:  She is still taking the Lyrica samples she was given.  It has really helped her pain.  She is going to check to see if she can use the co-pay card for the Lyrica.  If not, she will get the nortriptyline filled.    3)  Hyperlipidemia:  She continues doing well with her Pravastatin and needs a refill on it.     Review of Systems  Musculoskeletal: Negative for myalgias.  Skin: Negative for rash.  All other systems reviewed and are negative.   Past Medical History  Diagnosis Date  . Hypertension   . Allergy   . GERD (gastroesophageal reflux disease)   . Joint pain   . Trigeminal neuralgia   . Urticaria      Past Surgical History  Procedure Laterality Date  . Cesarean section  1980 1989  . 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    Alcohol Use:  Twice weekly (2 glasses of wine)    Drug Use:  None   Diet:  Regular   Exercise:  Cardio and weights three times weekly   Hobbies:  Sewing, Reading and gardening                  Family History  Problem Relation Age of Onset  . Hypertension Mother   . Stroke Mother   . Heart disease Father   . Hypertension Father    . Stroke Father   . Depression Maternal Grandmother   . Hypertension Brother   . Kidney disease Brother     Kidney Stones   . Cancer Sister     Breast/Uterine   . Hypertension Sister   . Kidney disease Sister     Kidney Stone   . Cancer Sister     Breast   . Hypertension Sister      Current Outpatient Prescriptions on File Prior to Visit  Medication Sig Dispense Refill  . losartan-hydrochlorothiazide (HYZAAR) 100-25 MG per tablet Take 1 tablet by mouth daily.  180 tablet  1  . pantoprazole (PROTONIX) 40 MG tablet Take 1 tablet (40 mg total) by mouth daily.  180 tablet  1  . potassium chloride (K-DUR) 10 MEQ tablet Take 1 tablet (10 mEq total) by mouth 2 (two) times daily.  180 tablet  1  . ranitidine (ZANTAC) 150 MG tablet Take 1-2 tabs per day for GI symptoms  180 tablet  3  . cyproheptadine (PERIACTIN) 4 MG tablet PRN for hives.      [name]  nortriptyline (PAMELOR) 25 MG capsule Take 1 capsule (25 mg total) by mouth at bedtime.  90 capsule  3   No current facility-administered medications on file prior to visit.     Allergies  Allergen Reactions  . Carbamazepine Hives  . Neurontin [Gabapentin] Hives     Immunization History  Administered Date(s) Administered  . Influenza,inj,Quad PF,36+ Mos 08/03/2013  . Tdap 11/03/2013        Objective:   Physical Exam  Constitutional: She appears well-nourished. No distress.  HENT:  Head: Normocephalic.  Eyes: No scleral icterus.  Neck: No thyromegaly present.  Cardiovascular: Normal rate, regular rhythm and normal heart sounds.   Pulmonary/Chest: Effort normal and breath sounds normal.  Abdominal: There is no tenderness.  Musculoskeletal: She exhibits no edema and no tenderness.  Neurological: She is alert.  Skin: Skin is warm and dry.  Psychiatric: She has a normal mood and affect. Her behavior is normal. Judgment and thought content normal.      Assessment & Plan:    Jamilex was seen today for medication  management.  Diagnoses and associated orders for this visit:  Trigeminal neuralgia pain - pregabalin (LYRICA) 75 MG capsule; Take 1 capsule (75 mg total) by mouth 3 (three) times daily.  Other and unspecified hyperlipidemia - Lipid panel; Future - pravastatin (PRAVACHOL) 20 MG tablet; Take 1 tablet (20 mg total) by mouth every evening.  Impaired fasting glucose - COMPLETE METABOLIC PANEL WITH GFR; Future - Hemoglobin A1c; Future  Need for prophylactic vaccination with combined diphtheria-tetanus-pertussis (DTP) vaccine - Tdap vaccine greater than or equal to 7yo IM

## 2013-11-03 NOTE — Patient Instructions (Signed)
1)  Neuralgia - Compare the Lyrica vs nortriptyline after you have activated the card  2)  Cholesterol - Continue on the pravastatin We'll recheck your level in early March.  3)  Vaccinations - You received the Tdap today we'll do the Zostavax at your next visit after you turn 60.     Tetanus, Diphtheria, Pertussis (Tdap) Vaccine What You Need to Know WHY GET VACCINATED? Tetanus, diphtheria and pertussis can be very serious diseases, even for adolescents and adults. Tdap vaccine can protect [place] from these diseases. TETANUS (Lockjaw) causes painful muscle tightening and stiffness, usually all over the body.  It can lead to tightening of muscles in the head and neck so you can't open your mouth, swallow, or sometimes even breathe. Tetanus kills about 1 out of 5 people who are infected. DIPHTHERIA can cause a thick coating to form in the back of the throat.  It can lead to breathing problems, paralysis, heart failure, and death. PERTUSSIS (Whooping Cough) causes severe coughing spells, which can cause difficulty breathing, vomiting and disturbed sleep.  It can also lead to weight loss, incontinence, and rib fractures. Up to 2 in 100 adolescents and 5 in 100 adults with pertussis are hospitalized or have complications, which could include pneumonia and death. These diseases are caused by bacteria. Diphtheria and pertussis are spread from person to person through coughing or sneezing. Tetanus enters the body through cuts, scratches, or wounds. Before vaccines, the [place] saw as many as 200,000 cases a year of diphtheria and pertussis, and hundreds of cases of tetanus. Since vaccination began, tetanus and diphtheria have dropped by about 99% and pertussis by about 80%. TDAP VACCINE Tdap vaccine can protect adolescents and adults from tetanus, diphtheria, and pertussis. One dose of Tdap is routinely given at age 23 or 14. People who did not get Tdap at that age should get it as soon as  possible. Tdap is especially important for health care professionals and anyone having close contact with a baby younger than 12 months. Pregnant women should get a dose of Tdap during every pregnancy, to protect the newborn from pertussis. Infants are most at risk for severe, life-threatening complications from pertussis. A similar vaccine, called Td, protects from tetanus and diphtheria, but not pertussis. A Td booster should be given every 10 years. Tdap may be given as one of these boosters if you have not already gotten a dose. Tdap may also be given after a severe cut or burn to prevent tetanus infection. Your doctor can give you more information. Tdap may safely be given at the same time as other vaccines. SOME PEOPLE SHOULD NOT GET THIS VACCINE  If you ever had a life-threatening allergic reaction after a dose of any tetanus, diphtheria, or pertussis containing vaccine, OR if you have a severe allergy to any part of this vaccine, you should not get Tdap. Tell your doctor if you have any severe allergies.  If you had a coma, or long or multiple seizures within 7 days after a childhood dose of DTP or DTaP, you should not get Tdap, unless a cause other than the vaccine was found. You can still get Td.  Talk to your doctor if you:  have epilepsy or another nervous system problem,  had severe pain or swelling after any vaccine containing diphtheria, tetanus or pertussis,  ever had Guillain-Barr Syndrome (GBS),  aren't feeling well on the day the shot is scheduled. RISKS OF A VACCINE REACTION With any medicine, including vaccines,  there is a chance of side effects. These are usually mild and go away on their own, but serious reactions are also possible. Brief fainting spells can follow a vaccination, leading to injuries from falling. Sitting or lying down for about 15 minutes can help prevent these. Tell your doctor if you feel dizzy or light-headed, or have vision changes or ringing in the  ears. Mild problems following Tdap (Did not interfere with activities)  Pain where the shot was given (about 3 in 4 adolescents or 2 in 3 adults)  Redness or swelling where the shot was given (about 1 person in 5)  Mild fever of at least 100.10F (up to about 1 in 25 adolescents or 1 in 100 adults)  Headache (about 3 or 4 people in 10)  Tiredness (about 1 person in 3 or 4)  Nausea, vomiting, diarrhea, stomach ache (up to 1 in 4 adolescents or 1 in 10 adults)  Chills, body aches, sore joints, rash, swollen glands (uncommon) Moderate problems following Tdap (Interfered with activities, but did not require medical attention)  Pain where the shot was given (about 1 in 5 adolescents or 1 in 100 adults)  Redness or swelling where the shot was given (up to about 1 in 16 adolescents or 1 in 25 adults)  Fever over 102F (about 1 in 100 adolescents or 1 in 250 adults)  Headache (about 3 in 20 adolescents or 1 in 10 adults)  Nausea, vomiting, diarrhea, stomach ache (up to 1 or 3 people in 100)  Swelling of the entire arm where the shot was given (up to about 3 in 100). Severe problems following Tdap (Unable to perform usual activities, required medical attention)  Swelling, severe pain, bleeding and redness in the arm where the shot was given (rare). A severe allergic reaction could occur after any vaccine (estimated less than 1 in a million doses). WHAT IF THERE IS A SERIOUS REACTION? What should I look for?  Look for anything that concerns you, such as signs of a severe allergic reaction, very high fever, or behavior changes. Signs of a severe allergic reaction can include hives, swelling of the face and throat, difficulty breathing, a fast heartbeat, dizziness, and weakness. These would start a few minutes to a few hours after the vaccination. What should I do?  If you think it is a severe allergic reaction or other emergency that can't wait, call 9-1-1 or get the person to the nearest  hospital. Otherwise, call your doctor.  Afterward, the reaction should be reported to the "Vaccine Adverse Event Reporting System" (VAERS). Your doctor might file this report, or you can do it yourself through the VAERS web site at www.vaers.LAgents.no, or by calling 1-640 185 5468. VAERS is only for reporting reactions. They do not give medical advice.  THE NATIONAL VACCINE INJURY COMPENSATION PROGRAM The National Vaccine Injury Compensation Program (VICP) is a federal program that was created to compensate people who may have been injured by certain vaccines. Persons who believe they may have been injured by a vaccine can learn about the program and about filing a claim by calling 1-(435)246-0342 or visiting the VICP website at SpiritualWord.at. HOW CAN I LEARN MORE?  Ask your doctor.  Call your local or state health department.  Contact the Centers for Disease Control and Prevention (CDC):  Call (819)511-7961 or visit CDC's website at PicCapture.uy. CDC Tdap Vaccine VIS (03/26/12) Document Released: 05/05/2012 Document Revised: 03/01/2013 Document Reviewed: 02/24/2013 [facility] Patient Information 2014 [place], [place].

## 2013-11-03 NOTE — Assessment & Plan Note (Signed)
She received her Boostrix without difficulty.  She was given a handout discussing possible side effects.  

## 2013-12-22 ENCOUNTER — Encounter: Payer: Self-pay | Admitting: Family Medicine

## 2013-12-23 ENCOUNTER — Encounter: Payer: Self-pay | Admitting: Family Medicine

## 2013-12-23 ENCOUNTER — Ambulatory Visit (INDEPENDENT_AMBULATORY_CARE_PROVIDER_SITE_OTHER): Payer: BC Managed Care – PPO | Admitting: Family Medicine

## 2013-12-23 VITALS — BP 108/80 | HR 98 | Resp 16 | Wt 208.0 lb

## 2013-12-23 DIAGNOSIS — R059 Cough, unspecified: Secondary | ICD-10-CM

## 2013-12-23 DIAGNOSIS — R05 Cough: Secondary | ICD-10-CM

## 2013-12-23 MED ORDER — HYDROCOD POLST-CHLORPHEN POLST 10-8 MG/5ML PO LQCR: 5.0000 mL | Freq: Two times a day (BID) | ORAL | Status: DC | PRN

## 2013-12-23 MED ORDER — BENZONATATE 200 MG PO CAPS: 200.0000 mg | ORAL_CAPSULE | Freq: Three times a day (TID) | ORAL | Status: DC | PRN

## 2013-12-23 NOTE — Patient Instructions (Addendum)
1)  Head Congestion - Zrytec D ( 1 tab twice a day)   2)  Chest Congestion/Cough - Mucinex DM (Max Strength- 1200/60) 2 x per day plus Tessalon 200 mg 3 x per day and Tussionex 1 tsp twice day; Ricola Honey Lemon with Echinacea +/- Fisherman's Friend +/- Halls; Vicks; Tsp Honey with Cinnamon.     Cough, Adult  A cough is a reflex that helps clear your throat and airways. It can help heal the body or may be a reaction to an irritated airway. A cough may only last 2 or 3 weeks (acute) or may last more than 8 weeks (chronic).  CAUSES Acute cough:  Viral or bacterial infections. Chronic cough:  Infections.  Allergies.  Asthma.  Post-nasal drip.  Smoking.  Heartburn or acid reflux.  Some medicines.  Chronic lung problems (COPD).  Cancer. SYMPTOMS   Cough.  Fever.  Chest pain.  Increased breathing rate.  High-pitched whistling sound when breathing (wheezing).  Colored mucus that you cough up (sputum). TREATMENT   A bacterial cough may be treated with antibiotic medicine.  A viral cough must run its course and will not respond to antibiotics.  Your caregiver may recommend other treatments if you have a chronic cough. HOME CARE INSTRUCTIONS   Only take over-the-counter or prescription medicines for pain, discomfort, or fever as directed by your caregiver. Use cough suppressants only as directed by your caregiver.  Use a cold steam vaporizer or humidifier in your bedroom or home to help loosen secretions.  Sleep in a semi-upright position if your cough is worse at night.  Rest as needed.  Stop smoking if you smoke. SEEK IMMEDIATE MEDICAL CARE IF:   You have pus in your sputum.  Your cough starts to worsen.  You cannot control your cough with suppressants and are losing sleep.  You begin coughing up blood.  You have difficulty breathing.  You develop pain which is getting worse or is uncontrolled with medicine.  You have a fever. MAKE SURE YOU:    Understand these instructions.  Will watch your condition.  Will get help right away if you are not doing well or get worse. Document Released: 05/03/2011 Document Revised: 01/27/2012 Document Reviewed: 05/03/2011 [facility] Patient Information 2014 [place], [place].

## 2013-12-23 NOTE — Progress Notes (Signed)
Subjective:    Patient ID: Andrea Melton, female    DOB: 1956/09/21, 58 y.o.   MRN: [id number]  Andrea Melton is here today complaining of URI symptoms.    URI  This is a new problem. The current episode started in the past 7 days. The problem has been gradually worsening. There has been no fever. Associated symptoms include congestion, coughing, headaches, rhinorrhea, sneezing and a sore throat. Pertinent negatives include no ear pain or sinus pain. She has tried antihistamine and NSAIDs for the symptoms. The treatment provided mild relief.     Review of Systems  HENT: Positive for congestion, rhinorrhea, sneezing and sore throat. Negative for ear pain.   Respiratory: Positive for cough.   Neurological: Positive for headaches.     Past Medical History  Diagnosis Date  . Hypertension   . Allergy   . GERD (gastroesophageal reflux disease)   . Joint pain   . Trigeminal neuralgia   . Urticaria      Past Surgical History  Procedure Laterality Date  . Cesarean section  1980 1989  . 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    Alcohol Use:  Twice weekly (2 glasses of wine)    Drug Use:  None   Diet:  Regular   Exercise:  Cardio and weights three times weekly   Hobbies:  Sewing, Reading and gardening                  Family History  Problem Relation Age of Onset  . Hypertension Mother   . Stroke Mother   . Heart disease Father   . Hypertension Father   . Stroke Father   . Depression Maternal Grandmother   . Hypertension Brother   . Kidney disease Brother     Kidney Stones    . Cancer Sister     Breast/Uterine   . Hypertension Sister   . Kidney disease Sister     Kidney Stone   . Cancer Sister     Breast   . Hypertension Sister      Current Outpatient Prescriptions on File Prior to Visit  Medication Sig Dispense Refill  . cyproheptadine (PERIACTIN) 4 MG tablet PRN for hives.      [name]. losartan-hydrochlorothiazide (HYZAAR) 100-25 MG per tablet Take 1 tablet by mouth daily.  180 tablet  1  . pantoprazole (PROTONIX) 40 MG tablet Take 1 tablet (40 mg total) by mouth daily.  180 tablet  1  . potassium chloride (K-DUR) 10 MEQ tablet Take 1 tablet (10 mEq total) by mouth 2 (two) times daily.  180 tablet  1  . pravastatin (PRAVACHOL) 20 MG tablet Take 1 tablet (20 mg total) by mouth every evening.  30 tablet  2  . pregabalin (LYRICA) 75 MG capsule Take 1 capsule (75 mg total) by mouth 3 (three) times daily.  90 capsule  5  . ranitidine (ZANTAC) 150 MG tablet Take 1-2 tabs per day for GI symptoms  180 tablet  3  .  nortriptyline (PAMELOR) 25 MG capsule Take 1 capsule (25 mg total) by mouth at bedtime.  90 capsule  3   No current facility-administered medications on file prior to visit.     Allergies  Allergen Reactions  . Carbamazepine Hives  . Neurontin [Gabapentin] Hives     Immunization History  Administered Date(s) Administered  . Influenza,inj,Quad PF,36+ Mos 08/03/2013  . Tdap 11/03/2013      Objective:   Physical Exam  Constitutional: She appears well-nourished. No distress.  HENT:  Head: Normocephalic.  Mouth/Throat: No oropharyngeal exudate.  Eyes: Conjunctivae are normal. Right eye exhibits no discharge. Left eye exhibits no discharge.  Neck: Neck supple.  Cardiovascular: Normal rate, regular rhythm and normal heart sounds.  Exam reveals no gallop and no friction rub.   No murmur heard. Pulmonary/Chest: Effort normal and breath sounds normal. She has no wheezes. She exhibits no tenderness.  Lymphadenopathy:    She has no cervical  adenopathy.  Neurological: She is alert.  Skin: Skin is warm and dry. No rash noted.  Psychiatric: She has a normal mood and affect.      Assessment & Plan:    [name] was seen today for uri.  Diagnoses and associated orders for this visit:  Cough - benzonatate (TESSALON) 200 MG capsule; Take 1 capsule (200 mg total) by mouth 3 (three) times daily as needed for cough. - chlorpheniramine-HYDROcodone (TUSSIONEX PENNKINETIC ER) 10-8 MG/5ML LQCR; Take 5 mLs by mouth every 12 (twelve) hours as needed.

## 2014-06-20 ENCOUNTER — Encounter: Payer: Self-pay | Admitting: Family Medicine

## 2014-06-20 ENCOUNTER — Ambulatory Visit (INDEPENDENT_AMBULATORY_CARE_PROVIDER_SITE_OTHER): Payer: BC Managed Care – PPO | Admitting: Family Medicine

## 2014-06-20 VITALS — BP 147/84 | HR 84 | Resp 16 | Ht 64.5 in | Wt 214.0 lb

## 2014-06-20 DIAGNOSIS — E785 Hyperlipidemia, unspecified: Secondary | ICD-10-CM

## 2014-06-20 DIAGNOSIS — I10 Essential (primary) hypertension: Secondary | ICD-10-CM

## 2014-06-20 DIAGNOSIS — K2100 Gastro-esophageal reflux disease with esophagitis, without bleeding: Secondary | ICD-10-CM

## 2014-06-20 DIAGNOSIS — E876 Hypokalemia: Secondary | ICD-10-CM

## 2014-06-20 DIAGNOSIS — G500 Trigeminal neuralgia: Secondary | ICD-10-CM

## 2014-06-20 DIAGNOSIS — R609 Edema, unspecified: Secondary | ICD-10-CM

## 2014-06-20 MED ORDER — FUROSEMIDE 40 MG PO TABS: ORAL_TABLET | ORAL | Status: DC

## 2014-06-20 MED ORDER — PRAVASTATIN SODIUM 20 MG PO TABS: 20.0000 mg | ORAL_TABLET | Freq: Every evening | ORAL | Status: DC

## 2014-06-20 MED ORDER — LOSARTAN POTASSIUM-HCTZ 100-12.5 MG PO TABS: 1.0000 | ORAL_TABLET | Freq: Two times a day (BID) | ORAL | Status: DC

## 2014-06-20 MED ORDER — POTASSIUM CHLORIDE ER 10 MEQ PO TBCR: 10.0000 meq | EXTENDED_RELEASE_TABLET | Freq: Two times a day (BID) | ORAL | Status: DC

## 2014-06-20 MED ORDER — PREGABALIN 75 MG PO CAPS: 75.0000 mg | ORAL_CAPSULE | Freq: Three times a day (TID) | ORAL | Status: DC

## 2014-06-20 MED ORDER — RANITIDINE HCL 150 MG PO TABS: ORAL_TABLET | ORAL | Status: DC

## 2014-06-20 MED ORDER — ESOMEPRAZOLE MAGNESIUM 40 MG PO CPDR: 40.0000 mg | DELAYED_RELEASE_CAPSULE | Freq: Two times a day (BID) | ORAL | Status: DC

## 2014-06-20 NOTE — Progress Notes (Signed)
Subjective:    Patient ID: Andrea Melton, female    DOB: 04-20-1956, 58 y.o.   MRN: [id number]  HPI  Andrea Melton is here today to get some medication refills. She did not come in prior to today's visit for labs.     1)  Hypertension:  Her BP is a little high at today's visit.  She is taking Hyzaar.    2)  GERD:  She is taking the combination of Protonix and Zantac as needed.  She feels that she needs something stronger.  3)  Neuropathy:  She is doing well on the Lyrica.   4)  Edema:  She has been having a lot of swelling in her ankles lately.   5)  Hyperlipidemia:  She has not been taking the Pravachol.     Review of Systems  Constitutional: Negative for activity change, appetite change, fatigue and unexpected weight change.  HENT: Negative.   Eyes: Negative.   Respiratory: Negative for shortness of breath.   Cardiovascular: Positive for leg swelling. Negative for chest pain and palpitations.  Gastrointestinal: Negative for diarrhea and constipation.  Endocrine: Negative.   Genitourinary: Negative for difficulty urinating.  Musculoskeletal: Negative.   Skin: Negative.   Neurological: Negative.   Hematological: Negative for adenopathy. Does not bruise/bleed easily.  Psychiatric/Behavioral: Negative for behavioral problems, sleep disturbance and dysphoric mood. The patient is not nervous/anxious.   All other systems reviewed and are negative.    Past Medical History  Diagnosis Date  . Hypertension   . Allergy   . GERD (gastroesophageal reflux disease)   . Joint pain   . Trigeminal neuralgia   . Urticaria      Past Surgical History  Procedure Laterality Date  . Cesarean section  1980 1989  . 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    Alcohol Use:  Twice weekly (2 glasses of wine)    Drug Use:  None   Diet:  Regular   Exercise:  Cardio and weights three times weekly   Hobbies:  Sewing, Reading and gardening                  Family History  Problem Relation Age of Onset  . Hypertension Mother   . Stroke Mother   . Heart disease Father   . Hypertension Father   . Stroke Father   . Depression Maternal Grandmother   . Hypertension Brother   . Kidney disease Brother     Kidney Stones   . Cancer Sister     Breast/Uterine   . Hypertension Sister   . Kidney disease Sister     Kidney Stone   . Cancer Sister     Breast   . Hypertension Sister      Current Outpatient Prescriptions on File Prior to Visit  Medication Sig Dispense Refill  . cyproheptadine (PERIACTIN) 4 MG tablet PRN for hives.      [name]. losartan-hydrochlorothiazide (HYZAAR) 100-25 MG per tablet Take 1 tablet by mouth daily.  180  tablet  1  . pantoprazole (PROTONIX) 40 MG tablet Take 1 tablet (40 mg total) by mouth daily.  180 tablet  1   No current facility-administered medications on file prior to visit.     Allergies  Allergen Reactions  . Carbamazepine Hives  . Neurontin [Gabapentin] Hives     Immunization History  Administered Date(s) Administered  . Influenza,inj,Quad PF,36+ Mos 08/03/2013  . Tdap 11/03/2013      Objective:   Physical Exam  Vitals reviewed. Constitutional: She appears well-nourished. No distress.  HENT:  Head: Normocephalic.  Eyes: No scleral icterus.  Neck: No thyromegaly present.  Cardiovascular: Normal rate, regular rhythm and normal heart sounds.   Pulmonary/Chest: Effort normal and breath sounds normal.  Abdominal: There is no tenderness.  Musculoskeletal: She exhibits no edema and no tenderness.  Neurological: She is alert.  Skin:  Skin is warm and dry.  Psychiatric: She has a normal mood and affect. Her behavior is normal. Judgment and thought content normal.      Assessment & Plan:    Bryona was seen today for medication management and medical management of chronic issues.  Diagnoses and associated orders for this visit:  Essential hypertension, benign - losartan-hydrochlorothiazide (HYZAAR) 100-12.5 MG per tablet; Take 1 tablet by mouth 2 (two) times daily.  Gastroesophageal reflux disease with esophagitis - esomeprazole (NEXIUM) 40 MG capsule; Take 1 capsule (40 mg total) by mouth 2 (two) times daily before a meal. - ranitidine (ZANTAC) 150 MG tablet; Take 1-2 tabs per day for GI symptoms  Edema - furosemide (LASIX) 40 MG tablet; Take 1/2 - 1 tab daily for edema  Other and unspecified hyperlipidemia - pravastatin (PRAVACHOL) 20 MG tablet; Take 1 tablet (20 mg total) by mouth every evening.  Low blood potassium - potassium chloride (K-DUR) 10 MEQ tablet; Take 1 tablet (10 mEq total) by mouth 2 (two) times daily.  Trigeminal neuralgia pain - pregabalin (LYRICA) 75 MG capsule; Take 1 capsule (75 mg total) by mouth 3 (three) times daily.   TIME SPENT "FACE TO FACE" WITH PATIENT -  30 MINS

## 2014-06-20 NOTE — Patient Instructions (Addendum)
1)  BP/Swelling - We need to get your BP down some along with the swelling in your feet.  To accomplish this let's try having you take Lasix 20-40 mg in the am along with you decreasing your sodium.  Take the Benicar 20 mg in the am and see how your BP/Edema look on this combination.  If you like this check with your insurance to see how they will cover it.  If it will be too high then you can go back to losartan HCT with a modification (100/12.5 twice a day vs 100/25 once a day).     2)  GERD - Try the Nexium 1-2 times per day with Zantac + Tums as needed.    3)  Cholesterol - Call my new office in October and shedule a lab appointment then see me to go over your labs and your BP/edema.     Low-Sodium Eating Plan Sodium raises blood pressure and causes water to be held in the body. Getting less sodium from food will help lower your blood pressure, reduce any swelling, and protect your heart, liver, and kidneys. We get sodium by adding salt (sodium chloride) to food. Most of our sodium comes from canned, boxed, and frozen foods. Restaurant foods, fast foods, and pizza are also very high in sodium. Even if you take medicine to lower your blood pressure or to reduce fluid in your body, getting less sodium from your food is important. WHAT IS MY PLAN? Most people should limit their sodium intake to 2,300 mg a day. Your health care provider recommends that you limit your sodium intake to __________ a day.  WHAT DO I NEED TO KNOW ABOUT THIS EATING PLAN? For the low-sodium eating plan, you will follow these general guidelines:  Choose foods with a % Daily Value for sodium of less than 5% (as listed on the food label).   Use salt-free seasonings or herbs instead of table salt or sea salt.   Check with your health care provider or pharmacist before using salt substitutes.   Eat fresh foods.  Eat more vegetables and fruits.  Limit canned vegetables. If you do use them, rinse them well to decrease  the sodium.   Limit cheese to 1 oz (28 g) per day.   Eat lower-sodium products, often labeled as "lower sodium" or "no salt added."  Avoid foods that contain monosodium glutamate (MSG). MSG is sometimes added to [place] food and some canned foods.  Check food labels (Nutrition Facts labels) on foods to learn how much sodium is in one serving.  Eat more home-cooked food and less restaurant, buffet, and fast food.  When eating at a restaurant, ask that your food be prepared with less salt or none, if possible.  HOW DO I READ FOOD LABELS FOR SODIUM INFORMATION? The Nutrition Facts label lists the amount of sodium in one serving of the food. If you eat more than one serving, you must multiply the listed amount of sodium by the number of servings. Food labels may also identify foods as:  Sodium free--Less than 5 mg in a serving.  Very low sodium--35 mg or less in a serving.  Low sodium--140 mg or less in a serving.  Light in sodium--50% less sodium in a serving. For example, if a food that usually has 300 mg of sodium is changed to become light in sodium, it will have 150 mg of sodium.  Reduced sodium--25% less sodium in a serving. For example, if a food  that usually has 400 mg of sodium is changed to reduced sodium, it will have 300 mg of sodium. WHAT FOODS CAN I EAT? Grains Low-sodium cereals, including oats, puffed wheat and rice, and shredded wheat cereals. Low-sodium crackers. Unsalted rice and pasta. Lower-sodium bread.  Vegetables Frozen or fresh vegetables. Low-sodium or reduced-sodium canned vegetables. Low-sodium or reduced-sodium tomato sauce and paste. Low-sodium or reduced-sodium tomato and vegetable juices.  Fruits Fresh, frozen, and canned fruit. Fruit juice.  Meat and Other Protein Products Low-sodium canned tuna and salmon. Fresh or frozen meat, poultry, seafood, and fish. Lamb. Unsalted nuts. Dried beans, peas, and lentils without added salt. Unsalted canned  beans. Homemade soups without salt. Eggs.  Dairy Milk. Soy milk. Ricotta cheese. Low-sodium or reduced-sodium cheeses. Yogurt.  Condiments Fresh and dried herbs and spices. Salt-free seasonings. Onion and garlic powders. Low-sodium varieties of mustard and ketchup. Lemon juice.  Fats and Oils Reduced-sodium salad dressings. Unsalted butter.  Other Unsalted popcorn and pretzels.  The items listed above may not be a complete list of recommended foods or beverages. Contact your dietitian for more options. WHAT FOODS ARE NOT RECOMMENDED? Grains Instant hot cereals. Bread stuffing, pancake, and biscuit mixes. Croutons. Seasoned rice or pasta mixes. Noodle soup cups. Boxed or frozen macaroni and cheese. Self-rising flour. Regular salted crackers. Vegetables Regular canned vegetables. Regular canned tomato sauce and paste. Regular tomato and vegetable juices. Frozen vegetables in sauces. Salted french fries. Olives. Rosita Fire. Relishes. Sauerkraut. Salsa. Meat and Other Protein Products Salted, canned, smoked, spiced, or pickled meats, seafood, or fish. Bacon, ham, sausage, hot dogs, corned beef, chipped beef, and packaged luncheon meats. Salt pork. Jerky. Pickled herring. Anchovies, regular canned tuna, and sardines. Salted nuts. Dairy Processed cheese and cheese spreads. Cheese curds. Blue cheese and cottage cheese. Buttermilk.  Condiments Onion and garlic salt, seasoned salt, table salt, and sea salt. Canned and packaged gravies. Worcestershire sauce. Tartar sauce. Barbecue sauce. Teriyaki sauce. Soy sauce, including reduced sodium. Steak sauce. Fish sauce. Oyster sauce. Cocktail sauce. Horseradish. Regular ketchup and mustard. Meat flavorings and tenderizers. Bouillon cubes. Hot sauce. Tabasco sauce. Marinades. Taco seasonings. Relishes. Fats and Oils Regular salad dressings. Salted butter. Margarine. Ghee. Bacon fat.  Other Potato and tortilla chips. Corn chips and puffs. Salted  popcorn and pretzels. Canned or dried soups. Pizza. Frozen entrees and pot pies.  The items listed above may not be a complete list of foods and beverages to avoid. Contact your dietitian for more information. Document Released: 04/26/2002 Document Revised: 11/09/2013 Document Reviewed: 09/08/2013 [facility] Patient Information 2015 [place], [place]. This information is not intended to replace advice given to you by your health care provider. Make sure you discuss any questions you have with your health care provider.

## 2014-07-19 ENCOUNTER — Other Ambulatory Visit: Payer: Self-pay | Admitting: Family Medicine

## 2015-08-31 ENCOUNTER — Emergency Department (HOSPITAL_BASED_OUTPATIENT_CLINIC_OR_DEPARTMENT_OTHER): Payer: BC Managed Care – PPO

## 2015-08-31 ENCOUNTER — Inpatient Hospital Stay (HOSPITAL_BASED_OUTPATIENT_CLINIC_OR_DEPARTMENT_OTHER)
Admission: EM | Admit: 2015-08-31 | Discharge: 2015-09-03 | DRG: 872 | Disposition: A | Discharge status: Home / Self Care | Payer: BC Managed Care – PPO | Attending: Internal Medicine | Admitting: Internal Medicine

## 2015-08-31 ENCOUNTER — Encounter (HOSPITAL_BASED_OUTPATIENT_CLINIC_OR_DEPARTMENT_OTHER): Payer: Self-pay | Admitting: *Deleted

## 2015-08-31 DIAGNOSIS — R6510 Systemic inflammatory response syndrome (SIRS) of non-infectious origin without acute organ dysfunction: Secondary | ICD-10-CM | POA: Diagnosis present

## 2015-08-31 DIAGNOSIS — E872 Acidosis, unspecified: Secondary | ICD-10-CM

## 2015-08-31 DIAGNOSIS — R739 Hyperglycemia, unspecified: Secondary | ICD-10-CM | POA: Diagnosis present

## 2015-08-31 DIAGNOSIS — Z818 Family history of other mental and behavioral disorders: Secondary | ICD-10-CM

## 2015-08-31 DIAGNOSIS — K219 Gastro-esophageal reflux disease without esophagitis: Secondary | ICD-10-CM

## 2015-08-31 DIAGNOSIS — Z66 Do not resuscitate: Secondary | ICD-10-CM | POA: Diagnosis present

## 2015-08-31 DIAGNOSIS — Z809 Family history of malignant neoplasm, unspecified: Secondary | ICD-10-CM

## 2015-08-31 DIAGNOSIS — Z87891 Personal history of nicotine dependence: Secondary | ICD-10-CM | POA: Diagnosis not present

## 2015-08-31 DIAGNOSIS — E669 Obesity, unspecified: Secondary | ICD-10-CM | POA: Diagnosis present

## 2015-08-31 DIAGNOSIS — Z823 Family history of stroke: Secondary | ICD-10-CM | POA: Diagnosis not present

## 2015-08-31 DIAGNOSIS — R6520 Severe sepsis without septic shock: Secondary | ICD-10-CM | POA: Diagnosis present

## 2015-08-31 DIAGNOSIS — A4150 Gram-negative sepsis, unspecified: Secondary | ICD-10-CM | POA: Diagnosis not present

## 2015-08-31 DIAGNOSIS — Z6834 Body mass index (BMI) 34.0-34.9, adult: Secondary | ICD-10-CM

## 2015-08-31 DIAGNOSIS — I10 Essential (primary) hypertension: Secondary | ICD-10-CM | POA: Diagnosis present

## 2015-08-31 DIAGNOSIS — K2270 Barrett's esophagus without dysplasia: Secondary | ICD-10-CM | POA: Diagnosis present

## 2015-08-31 DIAGNOSIS — E876 Hypokalemia: Secondary | ICD-10-CM | POA: Diagnosis present

## 2015-08-31 DIAGNOSIS — G500 Trigeminal neuralgia: Secondary | ICD-10-CM | POA: Diagnosis present

## 2015-08-31 DIAGNOSIS — N179 Acute kidney failure, unspecified: Secondary | ICD-10-CM | POA: Diagnosis present

## 2015-08-31 DIAGNOSIS — R1011 Right upper quadrant pain: Secondary | ICD-10-CM

## 2015-08-31 DIAGNOSIS — N132 Hydronephrosis with renal and ureteral calculous obstruction: Secondary | ICD-10-CM | POA: Diagnosis present

## 2015-08-31 DIAGNOSIS — N1330 Unspecified hydronephrosis: Secondary | ICD-10-CM

## 2015-08-31 DIAGNOSIS — N201 Calculus of ureter: Secondary | ICD-10-CM

## 2015-08-31 DIAGNOSIS — A419 Sepsis, unspecified organism: Secondary | ICD-10-CM

## 2015-08-31 DIAGNOSIS — N23 Unspecified renal colic: Secondary | ICD-10-CM

## 2015-08-31 DIAGNOSIS — N390 Urinary tract infection, site not specified: Secondary | ICD-10-CM

## 2015-08-31 DIAGNOSIS — Z8249 Family history of ischemic heart disease and other diseases of the circulatory system: Secondary | ICD-10-CM

## 2015-08-31 DIAGNOSIS — E785 Hyperlipidemia, unspecified: Secondary | ICD-10-CM | POA: Diagnosis present

## 2015-08-31 DIAGNOSIS — R7881 Bacteremia: Secondary | ICD-10-CM | POA: Diagnosis not present

## 2015-08-31 DIAGNOSIS — D72829 Elevated white blood cell count, unspecified: Secondary | ICD-10-CM | POA: Diagnosis present

## 2015-08-31 DIAGNOSIS — A4151 Sepsis due to Escherichia coli [E. coli]: Principal | ICD-10-CM | POA: Diagnosis present

## 2015-08-31 DIAGNOSIS — T501X5A Adverse effect of loop [high-ceiling] diuretics, initial encounter: Secondary | ICD-10-CM | POA: Diagnosis present

## 2015-08-31 DIAGNOSIS — R509 Fever, unspecified: Secondary | ICD-10-CM | POA: Diagnosis present

## 2015-08-31 LAB — COMPREHENSIVE METABOLIC PANEL
ALBUMIN: 4.6 g/dL (ref 3.5–5.0)
ALT: 52 U/L (ref 14–54)
AST: 35 U/L (ref 15–41)
Alkaline Phosphatase: 127 U/L — ABNORMAL HIGH (ref 38–126)
Anion gap: 10 (ref 5–15)
BUN: 15 mg/dL (ref 6–20)
CALCIUM: 9.8 mg/dL (ref 8.9–10.3)
CO2: 28 mmol/L (ref 22–32)
CREATININE: 1.02 mg/dL — AB (ref 0.44–1.00)
Chloride: 103 mmol/L (ref 101–111)
GFR calc non Af Amer: 58 mL/min — ABNORMAL LOW (ref >60)
GLUCOSE: 167 mg/dL — AB (ref 65–99)
Potassium: 3.4 mmol/L — ABNORMAL LOW (ref 3.5–5.1)
SODIUM: 141 mmol/L (ref 135–145)
Total Bilirubin: 1.4 mg/dL — ABNORMAL HIGH (ref 0.3–1.2)
Total Protein: 8.4 g/dL — ABNORMAL HIGH (ref 6.5–8.1)

## 2015-08-31 LAB — CBC WITH DIFFERENTIAL/PLATELET
BASOS ABS: 0 10*3/uL (ref 0.0–0.1)
Basophils Relative: 0 %
EOS ABS: 0.1 10*3/uL (ref 0.0–0.7)
EOS PCT: 1 %
HCT: 42.2 % (ref 36.0–46.0)
Hemoglobin: 13.8 g/dL (ref 12.0–15.0)
Lymphocytes Relative: 14 %
Lymphs Abs: 0.8 10*3/uL (ref 0.7–4.0)
MCH: 27.9 pg (ref 26.0–34.0)
MCHC: 32.7 g/dL (ref 30.0–36.0)
MCV: 85.4 fL (ref 78.0–100.0)
Monocytes Absolute: 0 10*3/uL — ABNORMAL LOW (ref 0.1–1.0)
Monocytes Relative: 1 %
Neutro Abs: 4.9 10*3/uL (ref 1.7–7.7)
Neutrophils Relative %: 84 %
PLATELETS: 269 10*3/uL (ref 150–400)
RBC: 4.94 MIL/uL (ref 3.87–5.11)
RDW: 13.3 % (ref 11.5–15.5)
WBC: 5.9 10*3/uL (ref 4.0–10.5)

## 2015-08-31 LAB — MAGNESIUM: Magnesium: 1.6 mg/dL — ABNORMAL LOW (ref 1.7–2.4)

## 2015-08-31 LAB — CREATININE, SERUM
Creatinine, Ser: 1 mg/dL (ref 0.44–1.00)
GFR calc non Af Amer: 60 mL/min — ABNORMAL LOW (ref >60)

## 2015-08-31 LAB — CBC
HEMATOCRIT: 32 % — AB (ref 36.0–46.0)
HEMOGLOBIN: 10.5 g/dL — AB (ref 12.0–15.0)
MCH: 27.8 pg (ref 26.0–34.0)
MCHC: 32.8 g/dL (ref 30.0–36.0)
MCV: 84.7 fL (ref 78.0–100.0)
Platelets: 214 10*3/uL (ref 150–400)
RBC: 3.78 MIL/uL — ABNORMAL LOW (ref 3.87–5.11)
RDW: 13.5 % (ref 11.5–15.5)
WBC: 16.1 10*3/uL — ABNORMAL HIGH (ref 4.0–10.5)

## 2015-08-31 LAB — URINALYSIS, ROUTINE W REFLEX MICROSCOPIC
Bilirubin Urine: NEGATIVE
GLUCOSE, UA: NEGATIVE mg/dL
Hgb urine dipstick: NEGATIVE
Ketones, ur: NEGATIVE mg/dL
Nitrite: NEGATIVE
PH: 7 (ref 5.0–8.0)
Protein, ur: NEGATIVE mg/dL
Specific Gravity, Urine: 1.008 (ref 1.005–1.030)
Urobilinogen, UA: 0.2 mg/dL (ref 0.0–1.0)

## 2015-08-31 LAB — TSH: TSH: 0.803 u[IU]/mL (ref 0.350–4.500)

## 2015-08-31 LAB — URINE MICROSCOPIC-ADD ON

## 2015-08-31 LAB — I-STAT CG4 LACTIC ACID, ED
Lactic Acid, Venous: 5.16 mmol/L (ref 0.5–2.0)
Lactic Acid, Venous: 0.88 mmol/L (ref 0.5–2.0)

## 2015-08-31 LAB — TROPONIN I: Troponin I: <0.03 ng/mL (ref <0.031)

## 2015-08-31 LAB — LIPASE, BLOOD: Lipase: 22 U/L (ref 22–51)

## 2015-08-31 MED ORDER — KETOROLAC TROMETHAMINE 30 MG/ML IJ SOLN
30.0000 mg | Freq: Once | INTRAMUSCULAR | Status: AC
  Administered 2015-08-31: 30 mg via INTRAVENOUS
  Filled 2015-08-31: qty 1

## 2015-08-31 MED ORDER — POTASSIUM CHLORIDE ER 10 MEQ PO TBCR
30.0000 meq | EXTENDED_RELEASE_TABLET | Freq: Every day | ORAL | Status: DC
  Administered 2015-08-31 – 2015-09-03 (×4): 30 meq via ORAL
  Filled 2015-08-31 (×5): qty 3

## 2015-08-31 MED ORDER — PANTOPRAZOLE SODIUM 40 MG PO TBEC
40.0000 mg | DELAYED_RELEASE_TABLET | Freq: Every day | ORAL | Status: DC
  Administered 2015-08-31 – 2015-09-02 (×3): 40 mg via ORAL
  Filled 2015-08-31 (×3): qty 1

## 2015-08-31 MED ORDER — FAMOTIDINE 20 MG PO TABS
20.0000 mg | ORAL_TABLET | Freq: Every day | ORAL | Status: DC
  Administered 2015-08-31 – 2015-09-02 (×3): 20 mg via ORAL
  Filled 2015-08-31 (×3): qty 1

## 2015-08-31 MED ORDER — CIPROFLOXACIN IN D5W 400 MG/200ML IV SOLN
400.0000 mg | Freq: Two times a day (BID) | INTRAVENOUS | Status: DC
  Administered 2015-08-31: 400 mg via INTRAVENOUS
  Filled 2015-08-31 (×2): qty 200

## 2015-08-31 MED ORDER — POLYETHYLENE GLYCOL 3350 17 G PO PACK: 17.0000 g | PACK | Freq: Every day | ORAL | Status: DC | PRN

## 2015-08-31 MED ORDER — HYDROMORPHONE HCL 1 MG/ML IJ SOLN
1.0000 mg | INTRAMUSCULAR | Status: DC | PRN
  Administered 2015-09-01: 1 mg via INTRAVENOUS
  Filled 2015-08-31: qty 1

## 2015-08-31 MED ORDER — CEFTRIAXONE SODIUM 1 G IJ SOLR
1.0000 g | Freq: Once | INTRAMUSCULAR | Status: AC
  Administered 2015-08-31: 1 g via INTRAVENOUS

## 2015-08-31 MED ORDER — IOHEXOL 300 MG/ML  SOLN
100.0000 mL | Freq: Once | INTRAMUSCULAR | Status: AC | PRN
  Administered 2015-08-31: 100 mL via INTRAVENOUS

## 2015-08-31 MED ORDER — ONDANSETRON HCL 4 MG PO TABS
4.0000 mg | ORAL_TABLET | Freq: Four times a day (QID) | ORAL | Status: DC | PRN
  Administered 2015-08-31: 4 mg via ORAL
  Filled 2015-08-31: qty 1

## 2015-08-31 MED ORDER — ONDANSETRON HCL 4 MG/2ML IJ SOLN: 4.0000 mg | Freq: Four times a day (QID) | INTRAMUSCULAR | Status: DC | PRN

## 2015-08-31 MED ORDER — SODIUM CHLORIDE 0.9 % IJ SOLN: 3.0000 mL | Freq: Two times a day (BID) | INTRAMUSCULAR | Status: DC

## 2015-08-31 MED ORDER — ACETAMINOPHEN 650 MG RE SUPP: 650.0000 mg | Freq: Four times a day (QID) | RECTAL | Status: DC | PRN

## 2015-08-31 MED ORDER — SODIUM CHLORIDE 0.9 % IV BOLUS (SEPSIS): 1000.0000 mL | Freq: Once | INTRAVENOUS | Status: DC

## 2015-08-31 MED ORDER — ACETAMINOPHEN 325 MG PO TABS
650.0000 mg | ORAL_TABLET | Freq: Four times a day (QID) | ORAL | Status: DC | PRN
  Administered 2015-09-01 – 2015-09-03 (×2): 650 mg via ORAL
  Filled 2015-08-31 (×2): qty 2

## 2015-08-31 MED ORDER — CIPROFLOXACIN IN D5W 400 MG/200ML IV SOLN: 400.0000 mg | Freq: Once | INTRAVENOUS | Status: DC

## 2015-08-31 MED ORDER — ONDANSETRON HCL 4 MG/2ML IJ SOLN
4.0000 mg | INTRAMUSCULAR | Status: DC | PRN
  Administered 2015-08-31: 4 mg via INTRAVENOUS
  Filled 2015-08-31: qty 2

## 2015-08-31 MED ORDER — CHLORHEXIDINE GLUCONATE 0.12 % MT SOLN
15.0000 mL | Freq: Two times a day (BID) | OROMUCOSAL | Status: DC
  Administered 2015-09-01 – 2015-09-02 (×3): 15 mL via OROMUCOSAL
  Filled 2015-08-31 (×5): qty 15

## 2015-08-31 MED ORDER — SODIUM CHLORIDE 0.9 % IV BOLUS (SEPSIS)
1000.0000 mL | Freq: Once | INTRAVENOUS | Status: AC
  Administered 2015-08-31: 1000 mL via INTRAVENOUS

## 2015-08-31 MED ORDER — FENTANYL CITRATE (PF) 100 MCG/2ML IJ SOLN
50.0000 ug | INTRAMUSCULAR | Status: DC | PRN
  Administered 2015-08-31 (×2): 50 ug via INTRAVENOUS
  Filled 2015-08-31 (×2): qty 2

## 2015-08-31 MED ORDER — CETYLPYRIDINIUM CHLORIDE 0.05 % MT LIQD
7.0000 mL | Freq: Two times a day (BID) | OROMUCOSAL | Status: DC
  Administered 2015-09-01 – 2015-09-02 (×3): 7 mL via OROMUCOSAL

## 2015-08-31 MED ORDER — OXYCODONE HCL 5 MG PO TABS
5.0000 mg | ORAL_TABLET | ORAL | Status: DC | PRN
  Administered 2015-08-31 – 2015-09-02 (×4): 5 mg via ORAL
  Filled 2015-08-31 (×4): qty 1

## 2015-08-31 MED ORDER — GABAPENTIN 100 MG PO CAPS
100.0000 mg | ORAL_CAPSULE | Freq: Three times a day (TID) | ORAL | Status: DC
  Administered 2015-08-31 – 2015-09-03 (×8): 100 mg via ORAL
  Filled 2015-08-31 (×9): qty 1

## 2015-08-31 MED ORDER — HYDRALAZINE HCL 20 MG/ML IJ SOLN: 10.0000 mg | Freq: Three times a day (TID) | INTRAMUSCULAR | Status: DC | PRN

## 2015-08-31 MED ORDER — CEFTRIAXONE SODIUM 1 G IJ SOLR
INTRAMUSCULAR | Status: AC
  Filled 2015-08-31: qty 10

## 2015-08-31 MED ORDER — HEPARIN SODIUM (PORCINE) 5000 UNIT/ML IJ SOLN
5000.0000 [IU] | Freq: Three times a day (TID) | INTRAMUSCULAR | Status: DC
  Administered 2015-08-31 – 2015-09-03 (×6): 5000 [IU] via SUBCUTANEOUS
  Filled 2015-08-31 (×8): qty 1

## 2015-08-31 MED ORDER — PRAVASTATIN SODIUM 20 MG PO TABS
20.0000 mg | ORAL_TABLET | Freq: Every evening | ORAL | Status: DC
  Administered 2015-08-31 – 2015-09-02 (×3): 20 mg via ORAL
  Filled 2015-08-31 (×4): qty 1

## 2015-08-31 MED ORDER — SODIUM CHLORIDE 0.9 % IV SOLN
INTRAVENOUS | Status: DC
  Administered 2015-08-31 – 2015-09-03 (×4): via INTRAVENOUS

## 2015-08-31 MED ORDER — CEFTRIAXONE SODIUM 1 G IJ SOLR
1.0000 g | INTRAMUSCULAR | Status: DC
  Administered 2015-09-01 – 2015-09-03 (×3): 1 g via INTRAVENOUS
  Filled 2015-08-31 (×3): qty 10

## 2015-08-31 NOTE — ED Notes (Signed)
Lactic Acid results 5.16 shown to Dr. Clydene PughKnott.

## 2015-08-31 NOTE — H&P (Signed)
[facility] History and Physical  Andrea Melton RUE:[id number] DOB: [birth year] DOA: 08/31/2015  Referring physician: Dr. Clydene Pugh PCP: Birdena Jubilee, MD   Chief Complaint: flank pain, nausea, elevated lactic acid and positive CT with ureterolithiasis   HPI: Andrea Melton is a 59 y.o. female with PMH significant for HTN, HLD, obesity, trigeminal neuralgia, GERD and urticaria; who presented to ED complaining of severe right flank pain and nausea. Patient reports that her symptoms presented approx 12-18 hours prior to presentation to ED; no radiation and no relieved by changing positions. She has never had a pain like this before. She experienced associated nausea, but no vomiting. Also endorses some increase frequency (but difficult for her to exactly quantify it as she is on diuretics). No fevers at home, but described chills. Patient denies cough, SOB, hematuria, hematochezia, melena, focal weakness or any other complaints.  In ED she was found with temp of 100.4, normal WBC's, mild hypokalemia, elevated lactic acid at 5.6; HR up to 135, hyperglycemia and UA with leukocytes and many bacteria. CT scan demonstrated stone in her UVJ and right hydronephrosis. [facility] called to admit patient for further evaluation and treatment.   Of note, Patient received aggressive IVF's resuscitation, urology was consulted and was initiated on rocephin and cipro (as recommended by urology service).   Review of Systems:  Patient reports some chest pressure, no radiation and no associated symptoms (she expressed that she experienced same sensation after a lot of exertion and/or under a lot of stress/pressure); otherwise ROS negative except as mentioned on HPI.  Past Medical History  Diagnosis Date  . Hypertension   . Allergy   . GERD (gastroesophageal reflux disease)   . Joint pain   . Trigeminal neuralgia   . Urticaria    Past Surgical History  Procedure Laterality Date  . Cesarean section   1980 1989  . Facial nerve surgery  2000    Gammaknife   Social History:  reports that she quit smoking about 46 years ago. Her smoking use included Cigarettes. She has a 1.25 pack-year smoking history. She has never used smokeless tobacco. She reports that she drinks about 1.2 oz of alcohol per week. She reports that she does not use illicit drugs.  Allergies  Allergen Reactions  . Carbamazepine Hives  . Dilantin [Phenytoin Sodium Extended]     Family History  Problem Relation Age of Onset  . Hypertension Mother   . Stroke Mother   . Heart disease Father   . Hypertension Father   . Stroke Father   . Depression Maternal Grandmother   . Hypertension Brother   . Kidney disease Brother     Kidney Stones   . Cancer Sister     Breast/Uterine   . Hypertension Sister   . Kidney disease Sister     Kidney Stone   . Cancer Sister     Breast   . Hypertension Sister    Prior to Admission medications   Medication Sig Start Date End Date Taking? Authorizing Provider  gabapentin (NEURONTIN) 100 MG capsule Take 100 mg by mouth 3 (three) times daily.   Yes Historical Provider, MD  cyproheptadine (PERIACTIN) 4 MG tablet PRN for hives. 06/04/13   Historical Provider, MD  furosemide (LASIX) 40 MG tablet Take 1/2 - 1 tab daily for edema 06/20/14 06/21/15  Gillian Scarce, MD  losartan-hydrochlorothiazide (HYZAAR) 100-12.5 MG per tablet Take 1 tablet by mouth 2 (two) times daily. 06/20/14 06/21/15  Gillian Scarce, MD  losartan-hydrochlorothiazide (  HYZAAR) 100-25 MG per tablet Take 1 tablet by mouth daily. 06/29/13 06/29/14  Gillian Scarce, MD  pantoprazole (PROTONIX) 40 MG tablet Take 1 tablet (40 mg total) by mouth daily. 06/29/13 06/29/14  Gillian Scarce, MD  potassium chloride (K-DUR) 10 MEQ tablet Take 1 tablet (10 mEq total) by mouth 2 (two) times daily. 06/20/14 06/20/15  Gillian Scarce, MD  pravastatin (PRAVACHOL) 20 MG tablet Take 1 tablet (20 mg total) by mouth every evening. 06/20/14 06/20/15  Gillian Scarce,  MD  ranitidine (ZANTAC) 150 MG tablet Take 1-2 tabs per day for GI symptoms 06/20/14 06/20/15  Gillian Scarce, MD   Physical Exam: Filed Vitals:   08/31/15 1630 08/31/15 1700 08/31/15 1730 08/31/15 1845  BP: 107/66 114/66 123/74 137/82  Pulse: 131 128 125 118  Temp:    99.1 F (37.3 C)  TempSrc:    Oral  Resp: 13 16 16 16   Height:    5\' 5"  (1.651 m)  Weight:    94.303 kg (207 lb 14.4 oz)  SpO2: 93% 92% 94% 97%    Wt Readings from Last 3 Encounters:  08/31/15 94.303 kg (207 lb 14.4 oz)  06/20/14 97.07 kg (214 lb)  12/23/13 94.348 kg (208 lb)    General:  Appears calm and in mild discomfort due to right flank pain. Patient was afebrile, overweight and no ill appearing. Denies N/V. Patient endorses some pressure sensation in her chest, but not frank pain. Eyes: PERRL, normal lids, irises & conjunctiva, no icterus, no nystagmus ENT: grossly normal hearing, no erythema, no exudates, no thrush; no drainage out of ears or nostrils. Neck: no LAD, masses or thyromegaly, short neck; no JVD appreciated Cardiovascular: tachycardic, no rubs, no gallops, no murmurs. Trace bilaterally LE edema (not new according to patient). Telemetry: SR, sinus tachcyardia Respiratory: CTA bilaterally, no w/r/r. Normal respiratory effort. Abdomen: soft, positive BS, positive right flank pain; no guarding  Skin: no rash or induration seen on exam Musculoskeletal: grossly normal tone BUE/BLE, no joint swelling  Psychiatric: grossly normal mood and affect, speech fluent and appropriate Neurologic: grossly non-focal.          Labs on Admission:  Basic Metabolic Panel:  Recent Labs Lab 08/31/15 1000  NA 141  K 3.4*  CL 103  CO2 28  GLUCOSE 167*  BUN 15  CREATININE 1.02*  CALCIUM 9.8   Liver Function Tests:  Recent Labs Lab 08/31/15 1000  AST 35  ALT 52  ALKPHOS 127*  BILITOT 1.4*  PROT 8.4*  ALBUMIN 4.6    Recent Labs Lab 08/31/15 1000  LIPASE 22   CBC:  Recent Labs Lab  08/31/15 1000  WBC 5.9  NEUTROABS 4.9  HGB 13.8  HCT 42.2  MCV 85.4  PLT 269   Radiological Exams on Admission: US Abdomen Complete  08/31/2015  CLINICAL DATA:  Abdominal pain, primarily toward the right EXAM: ULTRASOUND ABDOMEN COMPLETE COMPARISON:  None. FINDINGS: Gallbladder: No gallstones or wall thickening visualized. There is no pericholecystic fluid. No sonographic Murphy sign noted. Common bile duct: Diameter: 3 mm. There is no intrahepatic, common hepatic, or visualized common bile duct dilatation. Portions of the distal common bile duct are obscured by gas. Liver: No focal lesion identified.  Liver echogenicity is increased. IVC: No abnormality visualized in visualized regions. Portions of the inferior vena cava are obscured by gas. Pancreas: Essentially completely obscured by gas. Spleen: Size and appearance within normal limits. Right Kidney: Length: 11.4 cm. Echogenicity within normal limits.  No mass or hydronephrosis visualized. Left Kidney: Length: 11.4 cm. Echogenicity within normal limits. No mass or hydronephrosis visualized. Abdominal aorta: No aneurysm visualized in visualized regions. Much of the aorta is obscured by gas. Other findings: No demonstrable ascites. IMPRESSION: Pancreas obscured by gas. Portions of the inferior vena cava and aorta obscured by gas. Distal common bile duct also obscured by gas. Visualized regions of the structures appear normal. Liver echogenicity is increased, a finding most likely indicative of hepatic steatosis. While no focal liver lesions are identified, it must be cautioned that the sensitivity of ultrasound for focal liver lesions is diminished in this circumstance. Study otherwise unremarkable. Electronically Signed   By: Bretta BangWilliam  Woodruff III M.D.   On: 08/31/2015 11:13   Ct Abdomen Pelvis W Contrast  08/31/2015  CLINICAL DATA:  Abdominal pain starting this morning EXAM: CT ABDOMEN AND PELVIS WITH CONTRAST TECHNIQUE: Multidetector CT imaging  of the abdomen and pelvis was performed using the standard protocol following bolus administration of intravenous contrast. CONTRAST:  100mL OMNIPAQUE IOHEXOL 300 MG/ML  SOLN COMPARISON:  None. FINDINGS: Small hiatal hernia is noted.  Lung bases are unremarkable. Sagittal images of the spine shows mild degenerative changes lower thoracic spine. Enhanced liver is unremarkable. There is atrophic pancreas fatty replaced. No aortic aneurysm. The spleen and adrenal glands are unremarkable. There is subtle mild right perinephric stranding. Minimal delayed enhancement and delayed excretion of the right kidney. There is minimal right hydronephrosis. Mild right hydroureter. Axial image 80 there is 5 mm calcified obstructive calculus in right UVJ. Urinary bladder is under distended. Uterus and ovaries are unremarkable. Post tubal ligation surgical clips are noted. Moderate stool noted in right colon and cecum. No pericecal inflammation. Normal appendix. The terminal ileum is unremarkable. No inguinal adenopathy. No destructive bony lesions are noted within pelvis. No small bowel obstruction.  No ascites or free air.  No adenopathy. IMPRESSION: 1. There is mild right hydronephrosis and right hydroureter. 2. Minimal delayed enhancement and excretion of the right kidney consistent with obstructive uropathy. 3. There is 5 mm calcified obstructive calculus in right UVJ. 4. Moderate stool noted in right colon and cecum. No pericecal inflammation. Normal appendix. 5. No small bowel obstruction. These results were called by telephone at the time of interpretation on 08/31/2015 at 12:32 pm to Dr. Lyndal PulleyANIEL KNOTT , who verbally acknowledged these results. Electronically Signed   By: Natasha MeadLiviu  Pop M.D.   On: 08/31/2015 12:32    EKG:  Sinus tachycardia, no T waves or ST segment abnormalities suggesting ischemia appreciated   Assessment/Plan 1-SIRS (systemic inflammatory response syndrome) (HCC)/early sepsis: due to UTI without hematuria  and in presence of ureterolithiasis. Patient w/o prior hx of kidney stones. No respiratory issues, no fever, no signs of organ dysfunction and after IVF's resuscitation no toxic appearance. She doesn't qualify for severe sepsis. Given lactic acidosis and tachycardia, plus known source, she qualify for SIRS/early sepsis. -will admit to telemetry bed -continue IVF's, rate 100 cc/hr -following urology rec's will cover with rocephin and cipro -follow culture data (blood and urine) -will use PRN analgesics, antiemetics and antipyretics -will follow clinical response -repeated lactic acid is WNL, she received 4L of saline While in ED.    2-Essential hypertension, benign: soft on presentation, but stable. -will hold losartan and HCTZ for now -will monitor VS -PRN hydralazine -heart healthy diet  3-GERD (gastroesophageal reflux disease): will continue pepcid QHS and daily protonix  4-Lactic acidosis: due to infection, along with continue use of diuretics and  intravascular depletion -levels back to normal after IVF's resuscitation  5-Ureterolithiasis with Hydronephrosis of right kidney: urology has been consulted. -will follow rec's -patient with stone at UVJ -will keep NPO after midnight in case urology planning any intervention.   6-Hypokalemia: will check magnesium level -will replete potassium and follow electrolytes trend  7-Hyperglycemia:no hx of diabetes -CBG's 167 -will repeat fasting glucose in am -will check A1C  8-HLD: will continue statins and will check lipid panel   9-chest pressure: patient reports some pressure sensation on her chest; but endorses is not exactly pain. No radiation and denies associated SOB or other complaints. Atypical and doubting ACS. -will check EKG, cycle troponin and monitor her on telemetry -will continue PPI's -will replete electrolytes -will check lipid panel, A1C and TSH for stratification  10-obesity:  -Body mass index is 34.6  kg/(m^2). -low calorie diet and exercise discussed with patient    Urology service (Dr. Vernie Ammons), consulted by Dr. Clydene Pugh (EDP)    Code Status: DNR DVT Prophylaxis:heparin  Family Communication: husband at bedside (Andrea Melton) Disposition Plan: LOS > 2 midnights, inpatient, telemetry bed  Time spent: 70 minutes  Vassie Loll [facility] Pager [phone]

## 2015-08-31 NOTE — ED Provider Notes (Signed)
CSN: [id number]     Arrival date & time 08/31/15  0932 History   First MD Initiated Contact with Patient 08/31/15 0945     Chief Complaint  Patient presents with  . Abdominal Pain     (Consider location/radiation/quality/duration/timing/severity/associated sxs/prior Treatment) Patient is a 59 y.o. female presenting with abdominal pain. The history is provided by the patient.  Abdominal Pain Pain location:  RUQ Pain quality: sharp   Pain radiates to:  Does not radiate Pain severity:  Moderate Onset quality:  Sudden Duration:  2 hours Timing:  Constant Progression:  Worsening Chronicity:  New Context: not previous surgeries   Context comment:  At rest Relieved by:  Nothing Worsened by:  Nothing tried Ineffective treatments:  None tried Associated symptoms: nausea   Associated symptoms: no constipation and no diarrhea     Past Medical History  Diagnosis Date  . Hypertension   . Allergy   . GERD (gastroesophageal reflux disease)   . Joint pain   . Trigeminal neuralgia   . Urticaria    Past Surgical History  Procedure Laterality Date  . Cesarean section  1980 1989  . Facial nerve surgery  2000    Gammaknife   Family History  Problem Relation Age of Onset  . Hypertension Mother   . Stroke Mother   . Heart disease Father   . Hypertension Father   . Stroke Father   . Depression Maternal Grandmother   . Hypertension Brother   . Kidney disease Brother     Kidney Stones   . Cancer Sister     Breast/Uterine   . Hypertension Sister   . Kidney disease Sister     Kidney Stone   . Cancer Sister     Breast   . Hypertension Sister    Social History  Substance Use Topics  . Smoking status: Former Smoker -- 0.25 packs/day for 5 years    Types: Cigarettes    Quit date: 11/18/1968  . Smokeless tobacco: Never Used  . Alcohol Use: 1.2 oz/week    2 Glasses of wine per week     Comment: Twice Weekly    OB History    No data available     Review of Systems   Gastrointestinal: Positive for nausea, abdominal pain and abdominal distention. Negative for diarrhea and constipation.  All other systems reviewed and are negative.     Allergies  Carbamazepine and Dilantin  Home Medications   Prior to Admission medications   Medication Sig Start Date End Date Taking? Authorizing Provider  gabapentin (NEURONTIN) 100 MG capsule Take 100 mg by mouth 3 (three) times daily.   Yes Historical Provider, MD  cyproheptadine (PERIACTIN) 4 MG tablet PRN for hives. 06/04/13   Historical Provider, MD  furosemide (LASIX) 40 MG tablet Take 1/2 - 1 tab daily for edema 06/20/14 06/21/15  Gillian Scarce, MD  losartan-hydrochlorothiazide (HYZAAR) 100-12.5 MG per tablet Take 1 tablet by mouth 2 (two) times daily. 06/20/14 06/21/15  Gillian Scarce, MD  losartan-hydrochlorothiazide (HYZAAR) 100-25 MG per tablet Take 1 tablet by mouth daily. 06/29/13 06/29/14  Gillian Scarce, MD  pantoprazole (PROTONIX) 40 MG tablet Take 1 tablet (40 mg total) by mouth daily. 06/29/13 06/29/14  Gillian Scarce, MD  potassium chloride (K-DUR) 10 MEQ tablet Take 1 tablet (10 mEq total) by mouth 2 (two) times daily. 06/20/14 06/20/15  Gillian Scarce, MD  pravastatin (PRAVACHOL) 20 MG tablet Take 1 tablet (20 mg total) by mouth every  evening. 06/20/14 06/20/15  Gillian Scarce, MD  ranitidine (ZANTAC) 150 MG tablet Take 1-2 tabs per day for GI symptoms 06/20/14 06/20/15  Gillian Scarce, MD   BP 134/91 mmHg  Pulse 102  Temp(Src) 97.7 F (36.5 C) (Oral)  Resp 20  Ht  (1.651 m)  Wt 205 lb (92.987 kg)  BMI 34.11 kg/m2  SpO2 100% Physical Exam  Constitutional: She is oriented to person, place, and time. She appears well-developed and well-nourished. She appears ill (Appears very uncomfortable). No distress.  HENT:  Head: Normocephalic.  Eyes: Conjunctivae are normal.  Neck: Neck supple. No tracheal deviation present.  Cardiovascular: Normal rate, regular rhythm and normal heart sounds.   Pulmonary/Chest: Effort  normal and breath sounds normal. No respiratory distress.  Abdominal: Soft. She exhibits distension (Mild). There is tenderness (Right upper quadrant). There is guarding (Voluntary). There is no rebound.  Neurological: She is alert and oriented to person, place, and time.  Skin: Skin is warm. She is diaphoretic.  Psychiatric: She has a normal mood and affect.    ED Course  Procedures (including critical care time) CRITICAL CARE Performed by: Lyndal Pulley Total critical care time: 75 minutes Critical care time was exclusive of separately billable procedures and treating other patients. Critical care was necessary to treat or prevent imminent or life-threatening deterioration. Critical care was time spent personally by me on the following activities: development of treatment plan with patient and/or surrogate as well as nursing, discussions with consultants, evaluation of patient's response to treatment, examination of patient, obtaining history from patient or surrogate, ordering and performing treatments and interventions, ordering and review of laboratory studies, ordering and review of radiographic studies, pulse oximetry and re-evaluation of patient's condition.  Labs Review Labs Reviewed  CBC WITH DIFFERENTIAL/PLATELET - Abnormal; Notable for the following:    Monocytes Absolute 0.0 (*)    All other components within normal limits  COMPREHENSIVE METABOLIC PANEL - Abnormal; Notable for the following:    Potassium 3.4 (*)    Glucose, Bld 167 (*)    Creatinine, Ser 1.02 (*)    Total Protein 8.4 (*)    Alkaline Phosphatase 127 (*)    Total Bilirubin 1.4 (*)    GFR calc non Af Amer 58 (*)    All other components within normal limits  URINALYSIS, ROUTINE W REFLEX MICROSCOPIC (NOT AT [facility]) - Abnormal; Notable for the following:    Leukocytes, UA TRACE (*)    All other components within normal limits  URINE MICROSCOPIC-ADD ON - Abnormal; Notable for the following:    Bacteria, UA MANY  (*)    All other components within normal limits  I-STAT CG4 LACTIC ACID, ED - Abnormal; Notable for the following:    Lactic Acid, Venous 5.16 (*)    All other components within normal limits  GRAM STAIN  URINE CULTURE  CULTURE, BLOOD (ROUTINE X 2)  CULTURE, BLOOD (ROUTINE X 2)  LIPASE, BLOOD  I-STAT CG4 LACTIC ACID, ED    Imaging Review US Abdomen Complete  08/31/2015  CLINICAL DATA:  Abdominal pain, primarily toward the right EXAM: ULTRASOUND ABDOMEN COMPLETE COMPARISON:  None. FINDINGS: Gallbladder: No gallstones or wall thickening visualized. There is no pericholecystic fluid. No sonographic Murphy sign noted. Common bile duct: Diameter: 3 mm. There is no intrahepatic, common hepatic, or visualized common bile duct dilatation. Portions of the distal common bile duct are obscured by gas. Liver: No focal lesion identified.  Liver echogenicity is increased. IVC: No abnormality visualized  in visualized regions. Portions of the inferior vena cava are obscured by gas. Pancreas: Essentially completely obscured by gas. Spleen: Size and appearance within normal limits. Right Kidney: Length: 11.4 cm. Echogenicity within normal limits. No mass or hydronephrosis visualized. Left Kidney: Length: 11.4 cm. Echogenicity within normal limits. No mass or hydronephrosis visualized. Abdominal aorta: No aneurysm visualized in visualized regions. Much of the aorta is obscured by gas. Other findings: No demonstrable ascites. IMPRESSION: Pancreas obscured by gas. Portions of the inferior vena cava and aorta obscured by gas. Distal common bile duct also obscured by gas. Visualized regions of the structures appear normal. Liver echogenicity is increased, a finding most likely indicative of hepatic steatosis. While no focal liver lesions are identified, it must be cautioned that the sensitivity of ultrasound for focal liver lesions is diminished in this circumstance. Study otherwise unremarkable. Electronically Signed    By: Bretta Bang III M.D.   On: 08/31/2015 11:13   Ct Abdomen Pelvis W Contrast  08/31/2015  CLINICAL DATA:  Abdominal pain starting this morning EXAM: CT ABDOMEN AND PELVIS WITH CONTRAST TECHNIQUE: Multidetector CT imaging of the abdomen and pelvis was performed using the standard protocol following bolus administration of intravenous contrast. CONTRAST:  OMNIPAQUE IOHEXOL 300 MG/ML  SOLN COMPARISON:  None. FINDINGS: Small hiatal hernia is noted.  Lung bases are unremarkable. Sagittal images of the spine shows mild degenerative changes lower thoracic spine. Enhanced liver is unremarkable. There is atrophic pancreas fatty replaced. No aortic aneurysm. The spleen and adrenal glands are unremarkable. There is subtle mild right perinephric stranding. Minimal delayed enhancement and delayed excretion of the right kidney. There is minimal right hydronephrosis. Mild right hydroureter. Axial image 80 there is 5 mm calcified obstructive calculus in right UVJ. Urinary bladder is under distended. Uterus and ovaries are unremarkable. Post tubal ligation surgical clips are noted. Moderate stool noted in right colon and cecum. No pericecal inflammation. Normal appendix. The terminal ileum is unremarkable. No inguinal adenopathy. No destructive bony lesions are noted within pelvis. No small bowel obstruction.  No ascites or free air.  No adenopathy. IMPRESSION: 1. There is mild right hydronephrosis and right hydroureter. 2. Minimal delayed enhancement and excretion of the right kidney consistent with obstructive uropathy. 3. There is 5 mm calcified obstructive calculus in right UVJ. 4. Moderate stool noted in right colon and cecum. No pericecal inflammation. Normal appendix. 5. No small bowel obstruction. These results were called by telephone at the time of interpretation on 08/31/2015 at 12:32 pm to Dr. Lyndal Pulley , who verbally acknowledged these results. Electronically Signed   By: Natasha Mead M.D.   On:  08/31/2015 12:32   I have personally reviewed and evaluated these images and lab results as part of my medical decision-making.   EKG Interpretation   Date/Time:  Thursday August 31 2015 10:33:30 EDT Ventricular Rate:  127 PR Interval:  148 QRS Duration: 86 QT Interval:  302 QTC Calculation: 438 R Axis:   2 Text Interpretation:  Sinus tachycardia Otherwise normal ECG Confirmed by  Natsumi Whitsitt MD, Reuel Boom ([id number]) on 08/31/2015 10:36:33 AM      MDM   Final diagnoses:  Severe sepsis (HCC)  Lactic acidosis  Urinary tract infection without hematuria, site unspecified  Hydronephrosis of right kidney  Ureterolithiasis  Ureteral colic    59 year old feel presents with acute onset right upper quadrant abdominal pain that occurred this morning while at rest. She had very sharp pain that started suddenly. She is tachycardic, diaphoretic, and uncomfortable  appearing in the ED. She has significant right upper quadrant tenderness and abdominal distention. Considerations for differential diagnosis included pancreatitis, cholecystitis, atypical appendicitis, kidney stone, bowel obstruction, mesenteric ischemia. Pain is atypical and too reproducible for cardiac etiology and EKG unremarkable except for sinus tachycardia. No acute cholecystitis on right upper quadrant ultrasound, labs concerning for lactic acidosis, CT scan of abdomen and pelvis with contrast ordered for further evaluation.  CT scan consistent with obstructive ureterolithiasis on the right at the level of the UVJ, mild hydronephrosis. Patient is febrile during emergency Department course, continually tachycardic, appears ill and has evidence of bacteria in her urine. Her severely high lactic acidosis is suggestive of severe sepsis in the setting of urinary tract infection complicated by stone. Urology was consulted and I spoke with Dr. Vernie Ammonsttelin who recommended Cipro and ceftriaxone with medical stabilization. Despite 3 L of IV fluid  resuscitation the patient remained tachycardic, had no evidence of septic shock but continued to be ill appearing. Fourth liter of IV fluid was ordered to maximize resuscitation. Hospitalist was consulted for admission and [facility].   Lyndal Pulleyaniel Mcclellan Demarais, MD 08/31/15 [phone]

## 2015-08-31 NOTE — ED Notes (Signed)
Patient transported to Ultrasound 

## 2015-08-31 NOTE — Progress Notes (Signed)
59 year old lady with h/o hypertension presents with right upper quadrant and right flank pain. CT scan showed 5 mm obstructing stone at UVJ , with some mild hydronephrosis. [facility] and plan to admit to admit patient to hospitalist team and urology will see patient in consultation.  She was also found to be febrile, with lactic acidosis.   She will be admitted to telemetry.    Kathlen ModyVijaya Shatina Streets, MD [phone]

## 2015-08-31 NOTE — ED Notes (Signed)
Pt reports sudden onset of ruq pain this am around 8:00, describes as "sharp" and "constant". Denies any n/v/d or other c/o. Last bm this am, normal. Denies any urinary complaints or symptoms.

## 2015-08-31 NOTE — ED Notes (Signed)
Dr. Clydene PughKnott at bedside, requests delay of antibiotics for blood cultures drawn. Verbal order placed, scott, emt will draw blood cultures.

## 2015-09-01 ENCOUNTER — Other Ambulatory Visit: Payer: Self-pay

## 2015-09-01 DIAGNOSIS — G500 Trigeminal neuralgia: Secondary | ICD-10-CM | POA: Diagnosis present

## 2015-09-01 DIAGNOSIS — N200 Calculus of kidney: Secondary | ICD-10-CM

## 2015-09-01 DIAGNOSIS — N178 Other acute kidney failure: Secondary | ICD-10-CM

## 2015-09-01 DIAGNOSIS — I10 Essential (primary) hypertension: Secondary | ICD-10-CM

## 2015-09-01 DIAGNOSIS — E785 Hyperlipidemia, unspecified: Secondary | ICD-10-CM | POA: Diagnosis present

## 2015-09-01 DIAGNOSIS — B9689 Other specified bacterial agents as the cause of diseases classified elsewhere: Secondary | ICD-10-CM

## 2015-09-01 DIAGNOSIS — N1330 Unspecified hydronephrosis: Secondary | ICD-10-CM

## 2015-09-01 DIAGNOSIS — N179 Acute kidney failure, unspecified: Secondary | ICD-10-CM | POA: Diagnosis present

## 2015-09-01 DIAGNOSIS — A4150 Gram-negative sepsis, unspecified: Secondary | ICD-10-CM | POA: Diagnosis present

## 2015-09-01 DIAGNOSIS — D72829 Elevated white blood cell count, unspecified: Secondary | ICD-10-CM | POA: Diagnosis present

## 2015-09-01 DIAGNOSIS — E876 Hypokalemia: Secondary | ICD-10-CM

## 2015-09-01 DIAGNOSIS — N390 Urinary tract infection, site not specified: Secondary | ICD-10-CM | POA: Diagnosis present

## 2015-09-01 DIAGNOSIS — R7881 Bacteremia: Secondary | ICD-10-CM

## 2015-09-01 HISTORY — DX: Gram-negative sepsis, unspecified: A41.50

## 2015-09-01 LAB — LIPID PANEL
Cholesterol: 135 mg/dL (ref 0–200)
HDL: 60 mg/dL (ref >40)
LDL CALC: 67 mg/dL (ref 0–99)
TRIGLYCERIDES: 41 mg/dL (ref <150)
Total CHOL/HDL Ratio: 2.3 RATIO
VLDL: 8 mg/dL (ref 0–40)

## 2015-09-01 LAB — BASIC METABOLIC PANEL
Anion gap: 7 (ref 5–15)
BUN: 13 mg/dL (ref 6–20)
CO2: 24 mmol/L (ref 22–32)
Calcium: 8.5 mg/dL — ABNORMAL LOW (ref 8.9–10.3)
Chloride: 108 mmol/L (ref 101–111)
Creatinine, Ser: 0.89 mg/dL (ref 0.44–1.00)
GFR calc Af Amer: >60 mL/min (ref >60)
GFR calc non Af Amer: >60 mL/min (ref >60)
Glucose, Bld: 110 mg/dL — ABNORMAL HIGH (ref 65–99)
Potassium: 3.5 mmol/L (ref 3.5–5.1)
Sodium: 139 mmol/L (ref 135–145)

## 2015-09-01 LAB — CBC
HCT: 31.3 % — ABNORMAL LOW (ref 36.0–46.0)
Hemoglobin: 10.3 g/dL — ABNORMAL LOW (ref 12.0–15.0)
MCH: 28.3 pg (ref 26.0–34.0)
MCHC: 32.9 g/dL (ref 30.0–36.0)
MCV: 86 fL (ref 78.0–100.0)
PLATELETS: 204 10*3/uL (ref 150–400)
RBC: 3.64 MIL/uL — ABNORMAL LOW (ref 3.87–5.11)
RDW: 13.6 % (ref 11.5–15.5)
WBC: 9.9 10*3/uL (ref 4.0–10.5)

## 2015-09-01 LAB — TROPONIN I: Troponin I: <0.03 ng/mL (ref <0.031)

## 2015-09-01 NOTE — Progress Notes (Addendum)
Patient ID: Andrea Melton, female   DOB: [birth year], 59 y.o.   MRN: [id number] [facility] PROGRESS NOTE  Hendy Brindle PZW:[id number] DOB: 02-09-1956 DOA: 08/31/2015 PCP: [name], MD  Brief narrative:    59 y.o. female with past medical history significant for hypertension, dyslipidemia, trigeminal neuralgia who presented to [facility] with reports of severe right flank pain and associated nausea. Symptoms started about one day prior to this admission.   On admission, patient was hemodynamically stable. She was found to be febrile with T max of 100.4 F. her blood work demonstrated elevated white blood cell count of 16.1, hemoglobin 10.5. Lactic acid was 5.16. CT abdomen demonstrated 5 mm stone in right UVJ with right hydronephrosis. She was started on empiric Rocephin. GU will see her in consultation.  Hospital course complicated with finding of gram-negative rod bacteremia.   Anticipated discharge: Once GU issues resolve. Likely by 09/04/2015.  Assessment/Plan:    Principal Problem: Sepsis secondary to urinary tract infection / gram-negative rod bacteremia / leukocytosis - Sepsis criteria met on the admission with fever, tachycardia, tachypnea, leukocytosis. Source of infection is urinary tract infection as well as gram-negative rod bacteremia. Please note urinalysis on the admission demonstrated trace leukocytes with many bacteria. In addition blood cultures obtained at the time of the admission are growing gram-negative rods. - Continue Rocephin IV every 24 hours. -Patient is hemodynamically stable, she does not required pressor support. - Follow up final results of urine culture and blood cultures.    Active Problems:   Essential hypertension, benign - Lasix and losartan on hold. Blood pressure stable at 135/76.     Ureterolithiasis / right side hydronephrosis  - Secondary to the stone in the right UVJ.  - Appreciate urology consult for  recommendations.    Hypokalemia - Likely secondary to Lasix - Supplemented per home regimen   Acute renal failure - Likely secondary to hydronephrosis, UTI, sepsis and bacteremia - Also, possibly because of Lasix. Lasix on hold. - Creatinine now within normal limits.     Dyslipidemia - Continue Pravachol 20 mg daily    Trigeminal neuralgia - Continue gabapentin 100 mg 3 times daily  DVT Prophylaxis  -  heparin subcutaneous ordered    Code Status: DNR/DNI Family Communication:  plan of care discussed with the patient Disposition Plan: Home  likely by 09/04/2015   IV access:  Peripheral IV  Procedures and diagnostic studies:    US Abdomen Complete 08/31/2015   Pancreas obscured by gas. Portions of the inferior vena cava and aorta obscured by gas. Distal common bile duct also obscured by gas. Visualized regions of the structures appear normal. Liver echogenicity is increased, a finding most likely indicative of hepatic steatosis. While no focal liver lesions are identified, it must be cautioned that the sensitivity of ultrasound for focal liver lesions is diminished in this circumstance. Study otherwise unremarkable. Electronically Signed   By: Lowella Grip III M.D.   On: 08/31/2015 11:13   Ct Abdomen Pelvis W Contrast 08/31/2015  1. There is mild right hydronephrosis and right hydroureter. 2. Minimal delayed enhancement and excretion of the right kidney consistent with obstructive uropathy. 3. There is 5 mm calcified obstructive calculus in right UVJ. 4. Moderate stool noted in right colon and cecum. No pericecal inflammation. Normal appendix. 5. No small bowel obstruction. These results were called by telephone at the time of interpretation on 08/31/2015 at 12:32 pm to Dr. Leo Grosser , who verbally acknowledged these results. Electronically Signed  By: Lahoma Crocker M.D.   On: 08/31/2015 12:32    Medical Consultants:  GU  Other Consultants:  None   IAnti-Infectives:     Rocephin 08/31/2015 -->   Leisa Lenz, MD  [facility] Pager [phone]  Time spent in minutes: 25 minutes  If 7PM-7AM, please contact night-coverage [email or web address] Password [email or web address] 09/01/2015, 11:25 AM   LOS: 1 day    HPI/Subjective: No acute overnight events. Patient reports pain better controlled.   Objective: Filed Vitals:   08/31/15 1730 08/31/15 1845 08/31/15 2102 09/01/15 0435  BP: 123/74 137/82 136/77 135/76  Pulse: 125 118 113 116  Temp:  99.1 F (37.3 C) 98.4 F (36.9 C) 99.2 F (37.3 C)  TempSrc:  Oral Oral Oral  Resp: 16 16 16 16   Height:  5' 5"  (1.651 m)    Weight:  94.303 kg (207 lb 14.4 oz)    SpO2: 94% 97% 98% 94%    Intake/Output Summary (Last 24 hours) at 09/01/15 1125 Last data filed at 09/01/15 1050  Gross per 24 hour  Intake 1093.33 ml  Output   1450 ml  Net -356.67 ml    Exam:   General:  Pt is alert, follows commands appropriately, not in acute distress  Cardiovascular: Regular rate and rhythm, S1/S2, no murmurs  Respiratory: Clear to auscultation bilaterally, no wheezing, no crackles, no rhonchi  Abdomen: Soft, non tender, non distended, bowel sounds present  Extremities: No edema, pulses DP and PT palpable bilaterally  Neuro: Grossly nonfocal  Data Reviewed: Basic Metabolic Panel:  Recent Labs Lab 08/31/15 1000 08/31/15 2155 09/01/15 0420  NA 141  --  139  K 3.4*  --  3.5  CL 103  --  108  CO2 28  --  24  GLUCOSE 167*  --  110*  BUN 15  --  13  CREATININE 1.02* 1.00 0.89  CALCIUM 9.8  --  8.5*  MG  --  1.6*  --    Liver Function Tests:  Recent Labs Lab 08/31/15 1000  AST 35  ALT 52  ALKPHOS 127*  BILITOT 1.4*  PROT 8.4*  ALBUMIN 4.6    Recent Labs Lab 08/31/15 1000  LIPASE 22   No results for input(s): AMMONIA in the last 168 hours. CBC:  Recent Labs Lab 08/31/15 1000 08/31/15 2155 09/01/15 0420  WBC 5.9 16.1* 9.9  NEUTROABS 4.9  --   --   HGB 13.8 10.5* 10.3*  HCT 42.2 32.0* 31.3*   MCV 85.4 84.7 86.0  PLT 269 214 204   Cardiac Enzymes:  Recent Labs Lab 08/31/15 2155 09/01/15 0420 09/01/15 0941  TROPONINI <0.03 <0.03 <0.03   BNP: Invalid input(s): POCBNP CBG: No results for input(s): GLUCAP in the last 168 hours.  Recent Results (from the past 240 hour(s))  Urine culture     Status: None (Preliminary result)   Collection Time: 08/31/15 12:00 PM  Result Value Ref Range Status   Specimen Description URINE, CLEAN CATCH  Final   Special Requests NONE  Final   Culture   Final    CULTURE REINCUBATED FOR BETTER GROWTH Performed at [facility]    Report Status PENDING  Incomplete  Culture, blood (routine x 2)     Status: None (Preliminary result)   Collection Time: 08/31/15  2:30 PM  Result Value Ref Range Status   Specimen Description BLOOD LEFT HAND  Final   Special Requests BOTTLES DRAWN AEROBIC AND ANAEROBIC 5CC  Final   Culture  Setup  Time   Final    GRAM NEGATIVE RODS ANAEROBIC BOTTLE ONLY CRITICAL RESULT CALLED TO, READ BACK BY AND VERIFIED WITH: HUFF,J RN @ [phone] 09/01/15 LEONARD,A Performed at [facility]    Culture PENDING  Incomplete   Report Status PENDING  Incomplete  Culture, blood (routine x 2)     Status: None (Preliminary result)   Collection Time: 08/31/15  2:50 PM  Result Value Ref Range Status   Specimen Description BLOOD RIGHT HAND  Final   Special Requests BOTTLES DRAWN AEROBIC AND ANAEROBIC 5CC  Final   Culture PENDING  Incomplete   Report Status PENDING  Incomplete     Scheduled Meds: . antiseptic oral rinse  7 mL Mouth Rinse q12n4p  . cefTRIAXone (ROCEPHIN)  IV  1 g Intravenous Q24H  . chlorhexidine  15 mL Mouth Rinse BID  . famotidine  20 mg Oral QHS  . gabapentin  100 mg Oral TID  . heparin  5,000 Units Subcutaneous 3 times per day  . pantoprazole  40 mg Oral Daily  . potassium chloride  30 mEq Oral Daily  . pravastatin  20 mg Oral QPM  . sodium chloride  3 mL Intravenous Q12H   Continuous  Infusions: . sodium chloride 100 mL/hr at 08/31/15 2104

## 2015-09-01 NOTE — Care Management Note (Signed)
Case Management Note  Patient Details  Name: Christell FaithJanette Gengler MRN: [id number] Date of Birth: Mar 19, 1956  Subjective/Objective:  59 y/o f admitted w/SIRS. From home.                  Action/Plan:d/c plan home.   Expected Discharge Date:   (unknown)               Expected Discharge Plan:  Home/Self Care  In-House Referral:     Discharge planning Services  CM Consult  Post Acute Care Choice:    Choice offered to:     DME Arranged:    DME Agency:     HH Arranged:    HH Agency:     Status of Service:  In process, will continue to follow  Medicare Important Message Given:    Date Medicare IM Given:    Medicare IM give by:    Date Additional Medicare IM Given:    Additional Medicare Important Message give by:     If discussed at Long Length of Stay Meetings, dates discussed:    Additional Comments:  Lanier ClamMahabir, Eoghan Belcher, RN 09/01/2015, 3:24 PM

## 2015-09-01 NOTE — Consult Note (Signed)
Urology Consult  Referring physician: Dr. Charlies Silvers Reason for referral: 5 mm right distal ureteral stone with Escherichia coli urinary tract infection and questionable early urosepsis  History of Present Illness: Andrea Melton is currently 59 years of age and without prior urologic history. She was admitted as a transfer from [facility] with right flank pain nausea, and elevated lactic acid level and a CT scan positive for a 5 mm distal right ureteral stone. She has no prior history of nephrolithiasis. Apparently urology services were contacted last night from [facility]. At that time the patient's pain was under control and it was the urologist on call impression that the patient was going to be discharged home. Because of tachycardia and concerns for early sepsis the patient was subsequently transferred to [facility] and admitted. I was not contacted on call for consultation until late this afternoon.  Andrea Melton was having significant discomfort but is now pain-free. She has remained afebrile. Her tachycardia has resolved and she has been completely hemodynamically stable. White blood cell count is also normal. She remains on broad-spectrum antibiotics pending finalization of her urine culture and blood culture. Urine culture currently growing Escherichia coli and there is at least one positive blood culture for Escherichia coli as well. She does have some bladder pressure and increased frequency.  Past Medical History  Diagnosis Date  . Hypertension   . Allergy   . GERD (gastroesophageal reflux disease)   . Joint pain   . Trigeminal neuralgia   . Urticaria    Past Surgical History  Procedure Laterality Date  . Cesarean section  1980 1989  . Facial nerve surgery  2000    [facility]    Medications:  Scheduled: . antiseptic oral rinse  7 mL Mouth Rinse q12n4p  . cefTRIAXone (ROCEPHIN)  IV  1 g Intravenous Q24H  . chlorhexidine  15 mL Mouth Rinse BID  . famotidine   20 mg Oral QHS  . gabapentin  100 mg Oral TID  . heparin  5,000 Units Subcutaneous 3 times per day  . pantoprazole  40 mg Oral Daily  . potassium chloride  30 mEq Oral Daily  . pravastatin  20 mg Oral QPM  . sodium chloride  3 mL Intravenous Q12H    Allergies:  Allergies  Allergen Reactions  . Carbamazepine Hives  . Dilantin [Phenytoin Sodium Extended] Hives and Swelling    Family History  Problem Relation Age of Onset  . Hypertension Mother   . Stroke Mother   . Heart disease Father   . Hypertension Father   . Stroke Father   . Depression Maternal Grandmother   . Hypertension Brother   . Kidney disease Brother     Kidney Stones   . Cancer Sister     Breast/Uterine   . Hypertension Sister   . Kidney disease Sister     Kidney Stone   . Cancer Sister     Breast   . Hypertension Sister     Social History:  reports that she quit smoking about 46 years ago. Her smoking use included Cigarettes. She has a 1.25 pack-year smoking history. She has never used smokeless tobacco. She reports that she drinks about 1.2 oz of alcohol per week. She reports that she does not use illicit drugs.  ROS negative for fever or chills. Currently not having any significant discomfort. Positive for some bladder pressure and increased frequency.  Physical Exam:  Vital signs in last 24 hours: Temp:  [  98.4 F (36.9 C)-99.2 F (37.3 C)] 98.8 F (37.1 C) (10/14 1240) Pulse Rate:  [107-118] 107 (10/14 1240) Resp:  [16] 16 (10/14 1240) BP: (135-137)/(76-84) 137/84 mmHg (10/14 1240) SpO2:  [94 %-98 %] 94 % (10/14 1240) Weight:  [94.303 kg (207 lb 14.4 oz)] 94.303 kg (207 lb 14.4 oz) (10/13 1845)  Constitutional: Vital signs reviewed. WD WN in NAD Head: Normocephalic and atraumatic   Eyes: PERRL, No scleral icterus.  Neck: Supple No  Gross JVD, mass Cardiovascular: RRR Pulmonary/Chest: Normal effort Abdominal: Soft.  Genitourinary: Not examined Extremities: No cyanosis or edema   Neurological: Grossly non-focal.  Skin: Warm,very dry and intact. No rash, cyanosis   Laboratory Data:  Results for orders placed or performed during the hospital encounter of 08/31/15 (from the past 72 hour(s))  CBC with Differential/Platelet     Status: Abnormal   Collection Time: 08/31/15 10:00 AM  Result Value Ref Range   WBC 5.9 4.0 - 10.5 K/uL   RBC 4.94 3.87 - 5.11 MIL/uL   Hemoglobin 13.8 12.0 - 15.0 g/dL   HCT 42.2 36.0 - 46.0 %   MCV 85.4 78.0 - 100.0 fL   MCH 27.9 26.0 - 34.0 pg   MCHC 32.7 30.0 - 36.0 g/dL   RDW 13.3 11.5 - 15.5 %   Platelets 269 150 - 400 K/uL   Neutrophils Relative % 84 %   Neutro Abs 4.9 1.7 - 7.7 K/uL   Lymphocytes Relative 14 %   Lymphs Abs 0.8 0.7 - 4.0 K/uL   Monocytes Relative 1 %   Monocytes Absolute 0.0 (L) 0.1 - 1.0 K/uL   Eosinophils Relative 1 %   Eosinophils Absolute 0.1 0.0 - 0.7 K/uL   Basophils Relative 0 %   Basophils Absolute 0.0 0.0 - 0.1 K/uL  Comprehensive metabolic panel     Status: Abnormal   Collection Time: 08/31/15 10:00 AM  Result Value Ref Range   Sodium 141 135 - 145 mmol/L   Potassium 3.4 (L) 3.5 - 5.1 mmol/L   Chloride 103 101 - 111 mmol/L   CO2 28 22 - 32 mmol/L   Glucose, Bld 167 (H) 65 - 99 mg/dL   BUN 15 6 - 20 mg/dL   Creatinine, Ser 1.02 (H) 0.44 - 1.00 mg/dL   Calcium 9.8 8.9 - 10.3 mg/dL   Total Protein 8.4 (H) 6.5 - 8.1 g/dL   Albumin 4.6 3.5 - 5.0 g/dL   AST 35 15 - 41 U/L   ALT 52 14 - 54 U/L   Alkaline Phosphatase 127 (H) 38 - 126 U/L   Total Bilirubin 1.4 (H) 0.3 - 1.2 mg/dL   GFR calc non Af Amer 58 (L) >60 mL/min   GFR calc Af Amer >60 >60 mL/min    Comment: (NOTE) The eGFR has been calculated using the CKD EPI equation. This calculation has not been validated in all clinical situations. eGFR's persistently <60 mL/min signify possible Chronic Kidney Disease.    Anion gap 10 5 - 15  Lipase, blood     Status: None   Collection Time: 08/31/15 10:00 AM  Result Value Ref Range   Lipase 22  22 - 51 U/L  I-Stat CG4 Lactic Acid, ED     Status: Abnormal   Collection Time: 08/31/15 10:40 AM  Result Value Ref Range   Lactic Acid, Venous 5.16 (HH) 0.5 - 2.0 mmol/L  Urinalysis, Routine w reflex microscopic (not at [facility])     Status: Abnormal   Collection Time:  08/31/15 12:00 PM  Result Value Ref Range   Color, Urine YELLOW YELLOW   APPearance CLEAR CLEAR   Specific Gravity, Urine 1.008 1.005 - 1.030   pH 7.0 5.0 - 8.0   Glucose, UA NEGATIVE NEGATIVE mg/dL   Hgb urine dipstick NEGATIVE NEGATIVE   Bilirubin Urine NEGATIVE NEGATIVE   Ketones, ur NEGATIVE NEGATIVE mg/dL   Protein, ur NEGATIVE NEGATIVE mg/dL   Urobilinogen, UA 0.2 0.0 - 1.0 mg/dL   Nitrite NEGATIVE NEGATIVE   Leukocytes, UA TRACE (A) NEGATIVE  Urine microscopic-add on     Status: Abnormal   Collection Time: 08/31/15 12:00 PM  Result Value Ref Range   Squamous Epithelial / LPF RARE RARE   WBC, UA 3-6 <3 WBC/hpf   RBC / HPF 0-2 <3 RBC/hpf   Bacteria, UA MANY (A) RARE  Urine culture     Status: None (Preliminary result)   Collection Time: 08/31/15 12:00 PM  Result Value Ref Range   Specimen Description URINE, CLEAN CATCH    Special Requests NONE    Culture      >=100,000 COLONIES/mL GRAM NEGATIVE RODS Performed at [facility]    Report Status PENDING   Culture, blood (routine x 2)     Status: None (Preliminary result)   Collection Time: 08/31/15  2:30 PM  Result Value Ref Range   Specimen Description BLOOD LEFT HAND    Special Requests BOTTLES DRAWN AEROBIC AND ANAEROBIC 5CC    Culture  Setup Time      GRAM NEGATIVE RODS ANAEROBIC BOTTLE ONLY CRITICAL RESULT CALLED TO, READ BACK BY AND VERIFIED WITH: HUFF,J RN @ [id number] 09/01/15 LEONARD,A    Culture      NO GROWTH < 24 HOURS Performed at [facility]    Report Status PENDING   Culture, blood (routine x 2)     Status: None (Preliminary result)   Collection Time: 08/31/15  2:50 PM  Result Value Ref Range   Specimen Description BLOOD  RIGHT HAND    Special Requests BOTTLES DRAWN AEROBIC AND ANAEROBIC 5CC    Culture      NO GROWTH < 24 HOURS Performed at [facility]    Report Status PENDING   I-Stat CG4 Lactic Acid, ED     Status: None   Collection Time: 08/31/15  2:53 PM  Result Value Ref Range   Lactic Acid, Venous 0.88 0.5 - 2.0 mmol/L  TSH     Status: None   Collection Time: 08/31/15  9:55 PM  Result Value Ref Range   TSH 0.803 0.350 - 4.500 uIU/mL  Troponin I     Status: None   Collection Time: 08/31/15  9:55 PM  Result Value Ref Range   Troponin I <0.03 <0.031 ng/mL    Comment:        NO INDICATION OF MYOCARDIAL INJURY.   CBC     Status: Abnormal   Collection Time: 08/31/15  9:55 PM  Result Value Ref Range   WBC 16.1 (H) 4.0 - 10.5 K/uL   RBC 3.78 (L) 3.87 - 5.11 MIL/uL   Hemoglobin 10.5 (L) 12.0 - 15.0 g/dL   HCT 32.0 (L) 36.0 - 46.0 %   MCV 84.7 78.0 - 100.0 fL   MCH 27.8 26.0 - 34.0 pg   MCHC 32.8 30.0 - 36.0 g/dL   RDW 13.5 11.5 - 15.5 %   Platelets 214 150 - 400 K/uL  Creatinine, serum     Status: Abnormal   Collection  Time: 08/31/15  9:55 PM  Result Value Ref Range   Creatinine, Ser 1.00 0.44 - 1.00 mg/dL   GFR calc non Af Amer 60 (L) >60 mL/min   GFR calc Af Amer >60 >60 mL/min    Comment: (NOTE) The eGFR has been calculated using the CKD EPI equation. This calculation has not been validated in all clinical situations. eGFR's persistently <60 mL/min signify possible Chronic Kidney Disease.   Magnesium     Status: Abnormal   Collection Time: 08/31/15  9:55 PM  Result Value Ref Range   Magnesium 1.6 (L) 1.7 - 2.4 mg/dL  Lipid panel     Status: None   Collection Time: 09/01/15  4:20 AM  Result Value Ref Range   Cholesterol 135 0 - 200 mg/dL   Triglycerides 41 <150 mg/dL   HDL 60 >40 mg/dL   Total CHOL/HDL Ratio 2.3 RATIO   VLDL 8 0 - 40 mg/dL   LDL Cholesterol 67 0 - 99 mg/dL    Comment:        Total Cholesterol/HDL:CHD Risk Coronary Heart Disease Risk Table                      Men   Women  1/2 Average Risk   3.4   3.3  Average Risk       5.0   4.4  2 X Average Risk   9.6   7.1  3 X Average Risk  23.4   11.0        Use the calculated Patient Ratio above and the CHD Risk Table to determine the patient's CHD Risk.        ATP III CLASSIFICATION (LDL):  <100     mg/dL   Optimal  100-129  mg/dL   Near or Above                    Optimal  130-159  mg/dL   Borderline  160-189  mg/dL   High  >190     mg/dL   Very High Performed at [facility]   Troponin I     Status: None   Collection Time: 09/01/15  4:20 AM  Result Value Ref Range   Troponin I <0.03 <0.031 ng/mL    Comment:        NO INDICATION OF MYOCARDIAL INJURY.   Basic metabolic panel     Status: Abnormal   Collection Time: 09/01/15  4:20 AM  Result Value Ref Range   Sodium 139 135 - 145 mmol/L   Potassium 3.5 3.5 - 5.1 mmol/L   Chloride 108 101 - 111 mmol/L   CO2 24 22 - 32 mmol/L   Glucose, Bld 110 (H) 65 - 99 mg/dL   BUN 13 6 - 20 mg/dL   Creatinine, Ser 0.89 0.44 - 1.00 mg/dL   Calcium 8.5 (L) 8.9 - 10.3 mg/dL   GFR calc non Af Amer >60 >60 mL/min   GFR calc Af Amer >60 >60 mL/min    Comment: (NOTE) The eGFR has been calculated using the CKD EPI equation. This calculation has not been validated in all clinical situations. eGFR's persistently <60 mL/min signify possible Chronic Kidney Disease.    Anion gap 7 5 - 15  CBC     Status: Abnormal   Collection Time: 09/01/15  4:20 AM  Result Value Ref Range   WBC 9.9 4.0 - 10.5 K/uL   RBC 3.64 (L) 3.87 - 5.11 MIL/uL  Hemoglobin 10.3 (L) 12.0 - 15.0 g/dL   HCT 31.3 (L) 36.0 - 46.0 %   MCV 86.0 78.0 - 100.0 fL   MCH 28.3 26.0 - 34.0 pg   MCHC 32.9 30.0 - 36.0 g/dL   RDW 13.6 11.5 - 15.5 %   Platelets 204 150 - 400 K/uL  Troponin I     Status: None   Collection Time: 09/01/15  9:41 AM  Result Value Ref Range   Troponin I <0.03 <0.031 ng/mL    Comment:        NO INDICATION OF MYOCARDIAL INJURY.    Recent Results  (from the past 240 hour(s))  Urine culture     Status: None (Preliminary result)   Collection Time: 08/31/15 12:00 PM  Result Value Ref Range Status   Specimen Description URINE, CLEAN CATCH  Final   Special Requests NONE  Final   Culture   Final    >=100,000 COLONIES/mL GRAM NEGATIVE RODS Performed at [facility]    Report Status PENDING  Incomplete  Culture, blood (routine x 2)     Status: None (Preliminary result)   Collection Time: 08/31/15  2:30 PM  Result Value Ref Range Status   Specimen Description BLOOD LEFT HAND  Final   Special Requests BOTTLES DRAWN AEROBIC AND ANAEROBIC 5CC  Final   Culture  Setup Time   Final    GRAM NEGATIVE RODS ANAEROBIC BOTTLE ONLY CRITICAL RESULT CALLED TO, READ BACK BY AND VERIFIED WITH: HUFF,J RN @ [id number] 09/01/15 LEONARD,A    Culture   Final    NO GROWTH < 24 HOURS Performed at [facility]    Report Status PENDING  Incomplete  Culture, blood (routine x 2)     Status: None (Preliminary result)   Collection Time: 08/31/15  2:50 PM  Result Value Ref Range Status   Specimen Description BLOOD RIGHT HAND  Final   Special Requests BOTTLES DRAWN AEROBIC AND ANAEROBIC 5CC  Final   Culture   Final    NO GROWTH < 24 HOURS Performed at [facility]    Report Status PENDING  Incomplete   Creatinine:  Recent Labs  08/31/15 1000 08/31/15 2155 09/01/15 0420  CREATININE 1.02* 1.00 0.89   Baseline Creatinine:   Impression/Assessment:  5 mm distal right ureteral stone with evidence of early urosepsis. The normal standard of care for an obstructing stone with active urinary tract infection would be placement of a double-J stent or percutaneous nephrostomy tube. I explained to the patient and the family that it would not be the standard of care to try to manipulate the stone with either basket extraction or laser lithotripsy in the presence of active infection. Given the marked improvement in her clinical situation I think it  would be reasonable to continue to observe her and treat her with supportive care rather than bringing her down for a stent placement at this time. In addition she is currently not nothing by mouth and therefore the safest approach would be to wait at least 6 hours before consideration of stent placement. I suggested that we continue to treat her with IV antibiotics pending sensitivity reports. Once appropriate oral antibiotics are identified if her clinical situation continues to be encouraging she may transition to outpatient management. Her stone can then be managed definitively under one setting in 7-10 days or better yet she may pass the stone spontaneously.  Her situation however will need to be monitored closely. If she does develop increasing  pain, fever, chills, elevated white blood cell count or other signs and symptoms of sepsis then urgent stent placement will be necessary with subsequent definitive stone management at a later date. Again it is my impression given her marked clinical improvement that we can hold off on urgent stent placement at this time and continue with careful monitoring.   Plan:  As above. Of note the patient's husband is very angry about the delay in consultation. I again informed him that I was unaware of her admission for the need for consultation until just a couple of hours ago while I was in clinic. I have encouraged him to discuss with administration and evaluation of what broke down with regard to communication between the emergency room and urology services and that we would be happy in any way to assist in determining the root cause of for this communication failure. I would anticipate at least an additional 36-48 hours of hospitalization for IV antibiotics and final culture results.  Andrea Melton S 09/01/2015, 6:12 PM

## 2015-09-02 DIAGNOSIS — N179 Acute kidney failure, unspecified: Secondary | ICD-10-CM

## 2015-09-02 DIAGNOSIS — A4150 Gram-negative sepsis, unspecified: Secondary | ICD-10-CM

## 2015-09-02 LAB — HEMOGLOBIN A1C
HEMOGLOBIN A1C: 6.5 % — AB (ref 4.8–5.6)
MEAN PLASMA GLUCOSE: 140 mg/dL

## 2015-09-02 LAB — URINE CULTURE

## 2015-09-02 MED ORDER — PANTOPRAZOLE SODIUM 40 MG PO TBEC
40.0000 mg | DELAYED_RELEASE_TABLET | Freq: Every day | ORAL | Status: DC
  Administered 2015-09-02 – 2015-09-03 (×2): 40 mg via ORAL
  Filled 2015-09-02 (×2): qty 1

## 2015-09-02 MED ORDER — DIPHENHYDRAMINE HCL 25 MG PO CAPS
25.0000 mg | ORAL_CAPSULE | Freq: Four times a day (QID) | ORAL | Status: DC | PRN
  Administered 2015-09-02: 25 mg via ORAL
  Filled 2015-09-02: qty 1

## 2015-09-02 MED ORDER — LOSARTAN POTASSIUM 50 MG PO TABS
100.0000 mg | ORAL_TABLET | Freq: Two times a day (BID) | ORAL | Status: DC
  Administered 2015-09-02 – 2015-09-03 (×3): 100 mg via ORAL
  Filled 2015-09-02 (×3): qty 2

## 2015-09-02 NOTE — Progress Notes (Signed)
Read Dr Isabel CapriceGrapey note Pt feels quite good today without flank pain,fever WBC improved Recommend to repeat labs tomorrow and continue iv antibioitics and observation

## 2015-09-02 NOTE — Plan of Care (Signed)
Problem: Phase II Progression Outcomes Goal: Progress activity as tolerated unless otherwise ordered Outcome: Completed/Met Date Met:  09/02/15 Patient ambulating in halls with husband

## 2015-09-02 NOTE — Progress Notes (Addendum)
Patient ID: Andrea Melton, female   DOB: 18-Oct-1956, 59 y.o.   MRN: [id number] [facility] PROGRESS NOTE  Andrea Melton [id number]:[id number] DOB: Aug 21, 1956 DOA: 08/31/2015 PCP: [name], MD  Brief narrative:    59 y.o. female with past medical history significant for hypertension, dyslipidemia, trigeminal neuralgia who presented to [facility] hospital with reports of severe right flank pain and associated nausea. Symptoms started about one day prior to this admission.   On admission, patient was hemodynamically stable. She was found to be febrile with T max of 100.4 F. her blood work demonstrated elevated white blood cell count of 16.1, hemoglobin 10.5. Lactic acid was 5.16. CT abdomen demonstrated 5 mm stone in right UVJ with right hydronephrosis. She was started on empiric Rocephin. GU will see her in consultation.  Hospital course complicated with finding of gram-negative rod bacteremia.   Anticipated discharge: Once final blood culture results available.   Assessment/Plan:    Principal Problem: Sepsis secondary to urinary tract infection / gram-negative rod bacteremia / leukocytosis - Sepsis criteria met on the admission with fever, tachycardia, tachypnea, leukocytosis. GNR UTI and bacteremia are source of infection. - Treated empirically with rocephin until final culture results available. - Repeat Blood cultures tomorrow to ensure clearing of bacteremia.   Active Problems:   Essential hypertension, benign - Lasix and losartan on hold on admission due to stable blood pressure - Add losartan since SP in 150's. - Order placed for PRN hydralazine for BP above 150/90     Ureterolithiasis / right side hydronephrosis  - Secondary to the stone in the right UVJ.  - GU has seen her in consultation, no plans for stent placement at this time.     Hypokalemia - Secondary to Lasix - Supplemented and WNL   Acute renal failure - Secondary to hydronephrosis, UTI, sepsis  and bacteremia versus lasix which was placed on hold on admission.  - Creatinine normalized with fluids     Dyslipidemia - We will continue Pravachol 20 mg daily    Trigeminal neuralgia - We will continue gabapentin 100 mg 3 times daily    DVT Prophylaxis  -  Heparin subQ  Code Status: DNR/DNI Family Communication:  plan of care discussed with the patient Disposition Plan: Home once blood culture results available.   IV access:  Peripheral IV  Procedures and diagnostic studies:    US Abdomen Complete 08/31/2015   Pancreas obscured by gas. Portions of the inferior vena cava and aorta obscured by gas. Distal common bile duct also obscured by gas. Visualized regions of the structures appear normal. Liver echogenicity is increased, a finding most likely indicative of hepatic steatosis. While no focal liver lesions are identified, it must be cautioned that the sensitivity of ultrasound for focal liver lesions is diminished in this circumstance. Study otherwise unremarkable. Electronically Signed   By: Lowella Grip III M.D.   On: 08/31/2015 11:13   Ct Abdomen Pelvis W Contrast 08/31/2015  1. There is mild right hydronephrosis and right hydroureter. 2. Minimal delayed enhancement and excretion of the right kidney consistent with obstructive uropathy. 3. There is 5 mm calcified obstructive calculus in right UVJ. 4. Moderate stool noted in right colon and cecum. No pericecal inflammation. Normal appendix. 5. No small bowel obstruction. These results were called by telephone at the time of interpretation on 08/31/2015 at 12:32 pm to Dr. Leo Grosser , who verbally acknowledged these results. Electronically Signed   By: Lahoma Crocker M.D.   On: 08/31/2015 12:32  Medical Consultants:  GU  Other Consultants:  None   IAnti-Infectives:    Rocephin 08/31/2015 -->   Leisa Lenz, MD  [facility] Pager [phone]  Time spent in minutes: 25 minutes  If 7PM-7AM, please contact  night-coverage [email or web address] Password [email or web address] 09/02/2015, 11:08 AM   LOS: 2 days    HPI/Subjective: No acute overnight events. Pain controlled.   Objective: Filed Vitals:   09/01/15 0435 09/01/15 1240 09/01/15 2316 09/02/15 0539  BP: 135/76 137/84 135/81 152/89  Pulse: 116 107 98 107  Temp: 99.2 F (37.3 C) 98.8 F (37.1 C) 98.3 F (36.8 C) 99.4 F (37.4 C)  TempSrc: Oral Oral Oral Oral  Resp: 16 16 20 20   Height:      Weight:      SpO2: 94% 94% 95% 96%    Intake/Output Summary (Last 24 hours) at 09/02/15 1108 Last data filed at 09/02/15 1017  Gross per 24 hour  Intake   3170 ml  Output   1500 ml  Net   1670 ml    Exam:   General:  Pt is alert, not in acute distress  Cardiovascular: RRR, (+) S1, S2  Respiratory: No wheezing, no crackles, no rhonchi  Abdomen: (+) BS, non tender   Extremities: No leg swelling, palpable pulses   Neuro: No focal deficits   Data Reviewed: Basic Metabolic Panel:  Recent Labs Lab 08/31/15 1000 08/31/15 2155 09/01/15 0420  NA 141  --  139  K 3.4*  --  3.5  CL 103  --  108  CO2 28  --  24  GLUCOSE 167*  --  110*  BUN 15  --  13  CREATININE 1.02* 1.00 0.89  CALCIUM 9.8  --  8.5*  MG  --  1.6*  --    Liver Function Tests:  Recent Labs Lab 08/31/15 1000  AST 35  ALT 52  ALKPHOS 127*  BILITOT 1.4*  PROT 8.4*  ALBUMIN 4.6    Recent Labs Lab 08/31/15 1000  LIPASE 22   No results for input(s): AMMONIA in the last 168 hours. CBC:  Recent Labs Lab 08/31/15 1000 08/31/15 2155 09/01/15 0420  WBC 5.9 16.1* 9.9  NEUTROABS 4.9  --   --   HGB 13.8 10.5* 10.3*  HCT 42.2 32.0* 31.3*  MCV 85.4 84.7 86.0  PLT 269 214 204   Cardiac Enzymes:  Recent Labs Lab 08/31/15 2155 09/01/15 0420 09/01/15 0941  TROPONINI <0.03 <0.03 <0.03   BNP: Invalid input(s): POCBNP CBG: No results for input(s): GLUCAP in the last 168 hours.  Recent Results (from the past 240 hour(s))  Urine culture     Status: None  (Preliminary result)   Collection Time: 08/31/15 12:00 PM  Result Value Ref Range Status   Specimen Description URINE, CLEAN CATCH  Final   Special Requests NONE  Final   Culture   Final    >=100,000 COLONIES/mL GRAM NEGATIVE RODS Performed at [facility]    Report Status PENDING  Incomplete  Culture, blood (routine x 2)     Status: None (Preliminary result)   Collection Time: 08/31/15  2:30 PM  Result Value Ref Range Status   Specimen Description BLOOD LEFT HAND  Final   Special Requests BOTTLES DRAWN AEROBIC AND ANAEROBIC 5CC  Final   Culture  Setup Time   Final    GRAM NEGATIVE RODS ANAEROBIC BOTTLE ONLY CRITICAL RESULT CALLED TO, READ BACK BY AND VERIFIED WITH: HUFF,J RN @ 5537 09/01/15 LEONARD,A  Culture   Final    NO GROWTH < 24 HOURS Performed at [facility]    Report Status PENDING  Incomplete  Culture, blood (routine x 2)     Status: None (Preliminary result)   Collection Time: 08/31/15  2:50 PM  Result Value Ref Range Status   Specimen Description BLOOD RIGHT HAND  Final   Special Requests BOTTLES DRAWN AEROBIC AND ANAEROBIC 5CC  Final   Culture   Final    NO GROWTH < 24 HOURS Performed at [facility]    Report Status PENDING  Incomplete     Scheduled Meds: . antiseptic oral rinse  7 mL Mouth Rinse q12n4p  . cefTRIAXone (ROCEPHIN)  IV  1 g Intravenous Q24H  . chlorhexidine  15 mL Mouth Rinse BID  . famotidine  20 mg Oral QHS  . gabapentin  100 mg Oral TID  . heparin  5,000 Units Subcutaneous 3 times per day  . pantoprazole  40 mg Oral Daily  . potassium chloride  30 mEq Oral Daily  . pravastatin  20 mg Oral QPM  . sodium chloride  3 mL Intravenous Q12H   Continuous Infusions: . sodium chloride 100 mL/hr at 09/01/15 1842

## 2015-09-03 DIAGNOSIS — N201 Calculus of ureter: Secondary | ICD-10-CM

## 2015-09-03 DIAGNOSIS — B9620 Unspecified Escherichia coli [E. coli] as the cause of diseases classified elsewhere: Secondary | ICD-10-CM

## 2015-09-03 DIAGNOSIS — A4151 Sepsis due to Escherichia coli [E. coli]: Principal | ICD-10-CM

## 2015-09-03 DIAGNOSIS — N390 Urinary tract infection, site not specified: Secondary | ICD-10-CM

## 2015-09-03 LAB — CULTURE, BLOOD (ROUTINE X 2)

## 2015-09-03 MED ORDER — TAMSULOSIN HCL 0.4 MG PO CAPS: 0.4000 mg | ORAL_CAPSULE | Freq: Every day | ORAL | Status: DC

## 2015-09-03 MED ORDER — SULFAMETHOXAZOLE-TRIMETHOPRIM 800-160 MG PO TABS: 1.0000 | ORAL_TABLET | Freq: Two times a day (BID) | ORAL | Status: DC

## 2015-09-03 NOTE — Progress Notes (Signed)
Clinically doing very well Pt let me know she might go home today- I gave her Dr Isabel CapriceGrapey number He will see her this week with KUB Send home with (IF SHE GOES HOME TODAY) 1. Pain meds 2. Nausea meds 3. Flomax daily 0.4 mg 4. Strainer 5. Call us immediately if fever 6. 10 or more day of appropriate po antibiotic (DO NOT USE MACRODANTIN since it does not get tissue levels)

## 2015-09-03 NOTE — Discharge Instructions (Signed)
Bacteremia °Bacteremia is the presence of bacteria in the blood. A small amount of bacteria may not cause any symptoms. °Sometimes, the bacteria spread and cause infection in other parts of the body, such as the heart, joints, bones, or brain. Having a great amount of bacteria can cause a serious, sometimes life-threatening infection called sepsis. °CAUSES °This condition is caused by bacteria that get into the blood. Bacteria can enter the blood: °· During a dental or medical procedure. °· After you brush your teeth so hard that the gums bleed. °· Through a scrape or cut on your skin. °More severe types of bacteremia can be caused by: °· A bacterial infection, such as pneumonia, that spreads to the blood. °· Using a dirty needle. °RISK FACTORS °This condition is more likely to develop in: °· Children and elderly adults. °· People who have a long-lasting (chronic) disease or medical condition. °· People who have an artificial joint or heart valve. °· People who have heart valve disease. °· People who have a tube, such as a catheter or IV tube, that has been inserted for a medical treatment. °· People who have a weak body defense system (immune system). °· People who use IV drugs. °SYMPTOMS °Usually, this condition does not cause symptoms when it is mild. When it is more serious, it may cause: °· Fever. °· Chills. °· Racing heart. °· Shortness of breath. °· Dizziness. °· Weakness. °· Confusion. °· Nausea or vomiting. °· Diarrhea. °Bacteremia that has spread to other parts of the body may cause symptoms in those areas. °DIAGNOSIS °This condition may be diagnosed with a physical exam and tests, such as: °· A complete blood count (CBC). This test looks for signs of infection. °· Blood cultures. These look for bacteria in your blood. °· Tests of any IV tubes. These look for a source of infection. °· Urine tests. °· Imaging tests, such as an X-ray, CT scan, MRI, or heart ultrasound. °TREATMENT °If the condition is mild,  treatment is usually not needed. Usually, the body's immune system will remove the bacteria. If the condition is more serious, it may be treated with: °· Antibiotic medicines through an IV tube. These may be given for about 2 weeks. At first, the antibiotic that is given may kill most types of blood bacteria. If your test results show that a certain kind of bacteria is causing problems, the antibiotic may be changed to kill only the bacteria that are causing problems. °· Antibiotics taken by mouth. °· Removing any catheter or IV tube that is a source of infection. °· Blood pressure and breathing support, if needed. °· Surgery to control the source or spread of infection, if needed. °HOME CARE INSTRUCTIONS °· Take over-the-counter and prescription medicines only as told by your health care provider. °· If you were prescribed an antibiotic, take it as told by your health care provider. Do not stop taking the antibiotic even if you start to feel better. °· Rest at home until your condition is under control. °· Drink enough fluid to keep your urine clear or pale yellow. °· Keep all follow-up visits as told by your health care provider. This is important. °PREVENTION °Take these actions to help prevent future episodes of bacteremia: °· Get all vaccinations as recommended by your health care provider. °· Clean and cover scrapes or cuts. °· Bathe regularly. °· Wash your hands often. °· Before any dental or surgical procedure, ask your health care provider if you should take an antibiotic. °SEEK MEDICAL   CARE IF:  Your symptoms get worse.  You continue to have symptoms after treatment.  You develop new symptoms after treatment. SEEK IMMEDIATE MEDICAL CARE IF:  You have chest pain or trouble breathing.  You develop confusion, dizziness, or weakness.  You develop pale skin.   This information is not intended to replace advice given to you by your health care provider. Make sure you discuss any questions you have  with your health care provider.   Document Released: 08/18/2006 Document Revised: 07/26/2015 Document Reviewed: 01/07/2015 Elsevier Interactive Patient Education 2016 [employer] Urinary Tract Infection Urinary tract infections (UTIs) can develop anywhere along your urinary tract. Your urinary tract is your body's drainage system for removing wastes and extra water. Your urinary tract includes two kidneys, two ureters, a bladder, and a urethra. Your kidneys are a pair of bean-shaped organs. Each kidney is about the size of your fist. They are located below your ribs, one on each side of your spine. CAUSES Infections are caused by microbes, which are microscopic organisms, including fungi, viruses, and bacteria. These organisms are so small that they can only be seen through a microscope. Bacteria are the microbes that most commonly cause UTIs. SYMPTOMS  Symptoms of UTIs may vary by age and gender of the patient and by the location of the infection. Symptoms in young women typically include a frequent and intense urge to urinate and a painful, burning feeling in the bladder or urethra during urination. Older women and men are more likely to be tired, shaky, and weak and have muscle aches and abdominal pain. A fever may mean the infection is in your kidneys. Other symptoms of a kidney infection include pain in your back or sides below the ribs, nausea, and vomiting. DIAGNOSIS To diagnose a UTI, your caregiver will ask you about your symptoms. Your caregiver will also ask you to provide a urine sample. The urine sample will be tested for bacteria and white blood cells. White blood cells are made by your body to help fight infection. TREATMENT  Typically, UTIs can be treated with medication. Because most UTIs are caused by a bacterial infection, they usually can be treated with the use of antibiotics. The choice of antibiotic and length of treatment depend on your symptoms and the type of bacteria  causing your infection. HOME CARE INSTRUCTIONS  If you were prescribed antibiotics, take them exactly as your caregiver instructs you. Finish the medication even if you feel better after you have only taken some of the medication.  Drink enough water and fluids to keep your urine clear or pale yellow.  Avoid caffeine, tea, and carbonated beverages. They tend to irritate your bladder.  Empty your bladder often. Avoid holding urine for long periods of time.  Empty your bladder before and after sexual intercourse.  After a bowel movement, women should cleanse from front to back. Use each tissue only once. SEEK MEDICAL CARE IF:   You have back pain.  You develop a fever.  Your symptoms do not begin to resolve within 3 days. SEEK IMMEDIATE MEDICAL CARE IF:   You have severe back pain or lower abdominal pain.  You develop chills.  You have nausea or vomiting.  You have continued burning or discomfort with urination. MAKE SURE YOU:   Understand these instructions.  Will watch your condition.  Will get help right away if you are not doing well or get worse.   This information is not intended to replace advice given to you  by your health care provider. Make sure you discuss any questions you have with your health care provider.   Document Released: 08/14/2005 Document Revised: 07/26/2015 Document Reviewed: 12/13/2011 Elsevier Interactive Patient Education 2016 [employer] Sulfamethoxazole; Trimethoprim, SMX-TMP tablets What is this medicine? SULFAMETHOXAZOLE; TRIMETHOPRIM or SMX-TMP (suhl fuh meth OK suh zohl; trye METH oh prim) is a combination of a sulfonamide antibiotic and a second antibiotic, trimethoprim. It is used to treat or prevent certain kinds of bacterial infections. It will not work for colds, flu, or other viral infections. This medicine may be used for other purposes; ask your health care provider or pharmacist if you have questions. What should I tell my  health care provider before I take this medicine? They need to know if you have any of these conditions: -anemia -asthma -being treated with anticonvulsants -if you frequently drink alcohol containing drinks -kidney disease -liver disease -low level of folic acid or [id number] dehydrogenase -poor nutrition or malabsorption -porphyria -severe allergies -thyroid disorder -an unusual or allergic reaction to sulfamethoxazole, trimethoprim, sulfa drugs, other medicines, foods, dyes, or preservatives -pregnant or trying to get pregnant -breast-feeding How should I use this medicine? Take this medicine by mouth with a full glass of water. Follow the directions on the prescription label. Take your medicine at regular intervals. Do not take it more often than directed. Do not skip doses or stop your medicine early. Talk to your pediatrician regarding the use of this medicine in children. Special care may be needed. This medicine has been used in children as young as 75 months of age. Overdosage: If you think you have taken too much of this medicine contact a poison control center or emergency room at once. NOTE: This medicine is only for you. Do not share this medicine with others. What if I miss a dose? If you miss a dose, take it as soon as you can. If it is almost time for your next dose, take only that dose. Do not take double or extra doses. What may interact with this medicine? Do not take this medicine with any of the following medications: -aminobenzoate potassium -dofetilide -metronidazole This medicine may also interact with the following medications: -ACE inhibitors like benazepril, enalapril, lisinopril, and ramipril -birth control pills -cyclosporine -digoxin -diuretics -indomethacin -medicines for diabetes -methenamine -methotrexate -phenytoin -potassium supplements -pyrimethamine -sulfinpyrazone -tricyclic antidepressants -warfarin This list may not describe  all possible interactions. Give your health care provider a list of all the medicines, herbs, non-prescription drugs, or dietary supplements you use. Also tell them if you smoke, drink alcohol, or use illegal drugs. Some items may interact with your medicine. What should I watch for while using this medicine? Tell your doctor or health care professional if your symptoms do not improve. Drink several glasses of water a day to reduce the risk of kidney problems. Do not treat diarrhea with over the counter products. Contact your doctor if you have diarrhea that lasts more than 2 days or if it is severe and watery. This medicine can make you more sensitive to the sun. Keep out of the sun. If you cannot avoid being in the sun, wear protective clothing and use a sunscreen. Do not use sun lamps or tanning beds/booths. What side effects may I notice from receiving this medicine? Side effects that you should report to your doctor or health care professional as soon as possible: -allergic reactions like skin rash or hives, swelling of the face, lips, or tongue -breathing problems -fever or chills,  sore throat -irregular heartbeat, chest pain -joint or muscle pain -pain or difficulty passing urine -red pinpoint spots on skin -redness, blistering, peeling or loosening of the skin, including inside the mouth -unusual bleeding or bruising -unusually weak or tired -yellowing of the eyes or skin Side effects that usually do not require medical attention (report to your doctor or health care professional if they continue or are bothersome): -diarrhea -dizziness -headache -loss of appetite -nausea, vomiting -nervousness This list may not describe all possible side effects. Call your doctor for medical advice about side effects. You may report side effects to FDA at 1-800-FDA-1088. Where should I keep my medicine? Keep out of the reach of children. Store at room temperature between 20 to 25 degrees C (68 to  77 degrees F). Protect from light. Throw away any unused medicine after the expiration date. NOTE: This sheet is a summary. It may not cover all possible information. If you have questions about this medicine, talk to your doctor, pharmacist, or health care provider.    2016, [employer]. (2013-06-11 14:38:26)

## 2015-09-03 NOTE — Progress Notes (Signed)
Patient is stable for discharge. Discharge instructions and medications have been reviewed with the patient and all questions answered.   

## 2015-09-03 NOTE — Discharge Summary (Addendum)
Physician Discharge Summary  Andrea Melton EUM:[id number] DOB: Apr 10, 1956 DOA: 08/31/2015  PCP: Andrea Bible, MD  Admit date: 08/31/2015 Discharge date: 09/03/2015  Recommendations for Outpatient Follow-up:  1. Continue Bactrim on discharge for 10 days for E.Coli UTI and bacteremia  Discharge Diagnoses:  Principal Problem:   Sepsis due to Gram negative bacteria ([place]) Active Problems:   UTI (urinary tract infection)   Leukocytosis   Essential hypertension, benign   Ureterolithiasis   Hydronephrosis of right kidney   Hypokalemia   ARF (acute renal failure) (HCC)   Trigeminal neuralgia   Dyslipidemia    Discharge Condition: stable   Diet recommendation: as tolerated   History of present illness:  59 y.o. female with past medical history significant for hypertension, dyslipidemia, trigeminal neuralgia who presented to [facility] with reports of severe right flank pain and associated nausea. Symptoms started about one day prior to this admission.   On admission, patient was hemodynamically stable. She was found to be febrile with T max of 100.4 F. her blood work demonstrated elevated white blood cell count of 16.1, hemoglobin 10.5. Lactic acid was 5.16. CT abdomen demonstrated 5 mm stone in right UVJ with right hydronephrosis. She was started on empiric Rocephin. GU will see her in consultation.  Hospital course complicated with finding of E.Coli UTI and bacteremia. [name]   Hospital Course:   Assessment/Plan:    Principal Problem: Sepsis secondary to E.Coli urinary tract infection and E.Coli bacteremia / leukocytosis - Sepsis criteria met on the admission with fever, tachycardia, tachypnea, leukocytosis.  - Urine and blood cultures grew E.Coli, sensitive to bactrim - Renal function WNL so she will continue bactrim for 10 days on discharge  - Repeat blood cultures 1 set still with GNR but another set no growth, bactrim will cover for this and 10 days should be  ok which will complete total of 14 days treatment.   Active Problems:  Essential hypertension, benign - Lasix and losartan on hold on admission due to stable blood pressure - She may resume those meds on discharge since SBP in 140's range    Ureterolithiasis / right side hydronephrosis  - Secondary to the stone in the right UVJ.  - GU has seen her in consultation, no plans for stent placement at this time.    Hypokalemia - Secondary to Lasix - Supplemented   Acute renal failure - Secondary to hydronephrosis, UTI, sepsis and bacteremia versus lasix which was placed on hold on admission.  - Creatinine normalized with fluids    Dyslipidemia - Continue Pravachol 20 mg daily   Trigeminal neuralgia - Continue gabapentin 100 mg 3 times daily   DVT Prophylaxis  - Heparin subQ in hospital   Code Status: DNR/DNI Family Communication: plan of care discussed with the patient  IV access:  Peripheral IV  Procedures and diagnostic studies:   US Abdomen Complete 08/31/2015 Pancreas obscured by gas. Portions of the inferior vena cava and aorta obscured by gas. Distal common bile duct also obscured by gas. Visualized regions of the structures appear normal. Liver echogenicity is increased, a finding most likely indicative of hepatic steatosis. While no focal liver lesions are identified, it must be cautioned that the sensitivity of ultrasound for focal liver lesions is diminished in this circumstance. Study otherwise unremarkable. Electronically Signed By: Lowella Grip III M.D. On: 08/31/2015 11:13   Ct Abdomen Pelvis W Contrast 08/31/2015 1. There is mild right hydronephrosis and right hydroureter. 2. Minimal delayed enhancement and excretion of the right  kidney consistent with obstructive uropathy. 3. There is 5 mm calcified obstructive calculus in right UVJ. 4. Moderate stool noted in right colon and cecum. No pericecal inflammation. Normal appendix. 5. No small  bowel obstruction. These results were called by telephone at the time of interpretation on 08/31/2015 at 12:32 pm to Dr. Leo Melton , who verbally acknowledged these results. Electronically Signed By: Lahoma Crocker M.D. On: 08/31/2015 12:32    Medical Consultants:  GU  Other Consultants:  None   IAnti-Infectives:   Rocephin 08/31/2015 --> 10/16  Signed:  Leisa Lenz, MD  [facility] 09/03/2015, 10:27 AM  Pager #: [phone]  Time spent in minutes: more than 30 minutes   Discharge Exam: Filed Vitals:   09/03/15 0424  BP: 148/92  Pulse: 93  Temp: 98.9 F (37.2 C)  Resp: 18   Filed Vitals:   09/02/15 0539 09/02/15 1500 09/02/15 2118 09/03/15 0424  BP: 152/89 135/85 166/97 148/92  Pulse: 107 102 103 93  Temp: 99.4 F (37.4 C) 99 F (37.2 C) 99.5 F (37.5 C) 98.9 F (37.2 C)  TempSrc: Oral Oral Oral Oral  Resp: _0 Height:      Weight:      SpO2: 96% 96% 96% 96%    General: Pt is alert, follows commands appropriately, not in acute distress Cardiovascular: Regular rate and rhythm, S1/S2 +, no murmurs Respiratory: Clear to auscultation bilaterally, no wheezing, no crackles, no rhonchi Abdominal: Soft, non tender, non distended, bowel sounds +, no guarding Extremities: no edema, no cyanosis, pulses palpable bilaterally DP and PT Neuro: Grossly nonfocal  Discharge Instructions  Discharge Instructions    Call MD for:  difficulty breathing, headache or visual disturbances    Complete by:  As directed      Call MD for:  persistant dizziness or light-headedness    Complete by:  As directed      Call MD for:  persistant nausea and vomiting    Complete by:  As directed      Call MD for:  severe uncontrolled pain    Complete by:  As directed      Diet - low sodium heart healthy    Complete by:  As directed      Discharge instructions    Complete by:  As directed   1. Continue Bactrim on discharge for 10 days for E.Coli UTI and  bacteremia     Increase activity slowly    Complete by:  As directed             Medication List    STOP taking these medications        naproxen sodium 220 MG tablet  Commonly known as:  ANAPROX      TAKE these medications        B-complex with vitamin C tablet  Take 1 tablet by mouth daily.     cyproheptadine 4 MG tablet  Commonly known as:  PERIACTIN  PRN for hives.     furosemide 40 MG tablet  Commonly known as:  LASIX  Take 40 mg by mouth daily.     gabapentin 100 MG capsule  Commonly known as:  NEURONTIN  Take 100 mg by mouth 2 (two) times daily.     losartan 100 MG tablet  Commonly known as:  COZAAR  Take 100 mg by mouth 2 (two) times daily.     pantoprazole 40 MG tablet  Commonly known as:  PROTONIX  Take 40 mg  by mouth daily.     potassium chloride 10 MEQ tablet  Commonly known as:  K-DUR  Take 1 tablet by mouth daily.     pravastatin 20 MG tablet  Commonly known as:  PRAVACHOL  Take 1 tablet by mouth daily.     ranitidine 150 MG tablet  Commonly known as:  ZANTAC  Take 150 mg by mouth 2 (two) times daily.     sulfamethoxazole-trimethoprim 800-160 MG tablet  Commonly known as:  BACTRIM DS,SEPTRA DS  Take 1 tablet by mouth 2 (two) times daily.     tamsulosin 0.4 MG Caps capsule  Commonly known as:  FLOMAX  Take 1 capsule (0.4 mg total) by mouth daily.     Vitamin D (Ergocalciferol) 50000 UNITS Caps capsule  Commonly known as:  DRISDOL  Take 1 capsule by mouth 3 (three) times a week.            Follow-up Information    Schedule an appointment as soon as possible for a visit with Andrea Bible, MD.   Specialty:  Family Medicine   Why:  Follow up appt after recent hospitalization       The results of significant diagnostics from this hospitalization (including imaging, microbiology, ancillary and laboratory) are listed below for reference.    Significant Diagnostic Studies: US Abdomen Complete  08/31/2015  CLINICAL DATA:   Abdominal pain, primarily toward the right EXAM: ULTRASOUND ABDOMEN COMPLETE COMPARISON:  None. FINDINGS: Gallbladder: No gallstones or wall thickening visualized. There is no pericholecystic fluid. No sonographic Murphy sign noted. Common bile duct: Diameter: 3 mm. There is no intrahepatic, common hepatic, or visualized common bile duct dilatation. Portions of the distal common bile duct are obscured by gas. Liver: No focal lesion identified.  Liver echogenicity is increased. IVC: No abnormality visualized in visualized regions. Portions of the inferior vena cava are obscured by gas. Pancreas: Essentially completely obscured by gas. Spleen: Size and appearance within normal limits. Right Kidney: Length: 11.4 cm. Echogenicity within normal limits. No mass or hydronephrosis visualized. Left Kidney: Length: 11.4 cm. Echogenicity within normal limits. No mass or hydronephrosis visualized. Abdominal aorta: No aneurysm visualized in visualized regions. Much of the aorta is obscured by gas. Other findings: No demonstrable ascites. IMPRESSION: Pancreas obscured by gas. Portions of the inferior vena cava and aorta obscured by gas. Distal common bile duct also obscured by gas. Visualized regions of the structures appear normal. Liver echogenicity is increased, a finding most likely indicative of hepatic steatosis. While no focal liver lesions are identified, it must be cautioned that the sensitivity of ultrasound for focal liver lesions is diminished in this circumstance. Study otherwise unremarkable. Electronically Signed   By: Lowella Grip III M.D.   On: 08/31/2015 11:13   Ct Abdomen Pelvis W Contrast  08/31/2015  CLINICAL DATA:  Abdominal pain starting this morning EXAM: CT ABDOMEN AND PELVIS WITH CONTRAST TECHNIQUE: Multidetector CT imaging of the abdomen and pelvis was performed using the standard protocol following bolus administration of intravenous contrast. CONTRAST:  174m OMNIPAQUE IOHEXOL 300 MG/ML  SOLN  COMPARISON:  None. FINDINGS: Small hiatal hernia is noted.  Lung bases are unremarkable. Sagittal images of the spine shows mild degenerative changes lower thoracic spine. Enhanced liver is unremarkable. There is atrophic pancreas fatty replaced. No aortic aneurysm. The spleen and adrenal glands are unremarkable. There is subtle mild right perinephric stranding. Minimal delayed enhancement and delayed excretion of the right kidney. There is minimal right hydronephrosis. Mild right hydroureter. Axial image  80 there is 5 mm calcified obstructive calculus in right UVJ. Urinary bladder is under distended. Uterus and ovaries are unremarkable. Post tubal ligation surgical clips are noted. Moderate stool noted in right colon and cecum. No pericecal inflammation. Normal appendix. The terminal ileum is unremarkable. No inguinal adenopathy. No destructive bony lesions are noted within pelvis. No small bowel obstruction.  No ascites or free air.  No adenopathy. IMPRESSION: 1. There is mild right hydronephrosis and right hydroureter. 2. Minimal delayed enhancement and excretion of the right kidney consistent with obstructive uropathy. 3. There is 5 mm calcified obstructive calculus in right UVJ. 4. Moderate stool noted in right colon and cecum. No pericecal inflammation. Normal appendix. 5. No small bowel obstruction. These results were called by telephone at the time of interpretation on 08/31/2015 at 12:32 pm to Dr. Leo Melton , who verbally acknowledged these results. Electronically Signed   By: Lahoma Crocker M.D.   On: 08/31/2015 12:32    Microbiology: Recent Results (from the past 240 hour(s))  Urine culture     Status: None   Collection Time: 08/31/15 12:00 PM  Result Value Ref Range Status   Specimen Description URINE, CLEAN CATCH  Final   Special Requests NONE  Final   Culture   Final    >=100,000 COLONIES/mL ESCHERICHIA COLI Performed at [facility]    Report Status 09/02/2015 FINAL  Final    Organism ID, Bacteria ESCHERICHIA COLI  Final      Susceptibility   Escherichia coli - MIC*    AMPICILLIN 8 SENSITIVE Sensitive     CEFAZOLIN <=4 SENSITIVE Sensitive     CEFTRIAXONE <=1 SENSITIVE Sensitive     CIPROFLOXACIN >=4 RESISTANT Resistant     GENTAMICIN <=1 SENSITIVE Sensitive     IMIPENEM <=0.25 SENSITIVE Sensitive     NITROFURANTOIN <=16 SENSITIVE Sensitive     TRIMETH/SULFA <=20 SENSITIVE Sensitive     AMPICILLIN/SULBACTAM 4 SENSITIVE Sensitive     PIP/TAZO <=4 SENSITIVE Sensitive     * >=100,000 COLONIES/mL ESCHERICHIA COLI  Culture, blood (routine x 2)     Status: None   Collection Time: 08/31/15  2:30 PM  Result Value Ref Range Status   Specimen Description BLOOD LEFT HAND  Final   Special Requests BOTTLES DRAWN AEROBIC AND ANAEROBIC 5CC  Final   Culture  Setup Time   Final    GRAM NEGATIVE RODS ANAEROBIC BOTTLE ONLY CRITICAL RESULT CALLED TO, READ BACK BY AND VERIFIED WITH: HUFF,J RN @ [id number] 09/01/15 LEONARD,A    Culture   Final    ESCHERICHIA COLI Performed at [facility]    Report Status 09/03/2015 FINAL  Final   Organism ID, Bacteria ESCHERICHIA COLI  Final      Susceptibility   Escherichia coli - MIC*    AMPICILLIN 8 SENSITIVE Sensitive     CEFAZOLIN <=4 SENSITIVE Sensitive     CEFEPIME <=1 SENSITIVE Sensitive     CEFTAZIDIME <=1 SENSITIVE Sensitive     CEFTRIAXONE <=1 SENSITIVE Sensitive     CIPROFLOXACIN >=4 RESISTANT Resistant     GENTAMICIN <=1 SENSITIVE Sensitive     IMIPENEM <=0.25 SENSITIVE Sensitive     TRIMETH/SULFA <=20 SENSITIVE Sensitive     AMPICILLIN/SULBACTAM 4 SENSITIVE Sensitive     PIP/TAZO <=4 SENSITIVE Sensitive     * ESCHERICHIA COLI  Culture, blood (routine x 2)     Status: None (Preliminary result)   Collection Time: 08/31/15  2:50 PM  Result Value Ref Range Status  Specimen Description BLOOD RIGHT HAND  Final   Special Requests BOTTLES DRAWN AEROBIC AND ANAEROBIC 5CC  Final   Culture   Final    NO GROWTH 2  DAYS Performed at [facility]    Report Status PENDING  Incomplete     Labs: Basic Metabolic Panel:  Recent Labs Lab 08/31/15 1000 08/31/15 2155 09/01/15 0420  NA 141  --  139  K 3.4*  --  3.5  CL 103  --  108  CO2 28  --  24  GLUCOSE 167*  --  110*  BUN 15  --  13  CREATININE 1.02* 1.00 0.89  CALCIUM 9.8  --  8.5*  MG  --  1.6*  --    Liver Function Tests:  Recent Labs Lab 08/31/15 1000  AST 35  ALT 52  ALKPHOS 127*  BILITOT 1.4*  PROT 8.4*  ALBUMIN 4.6    Recent Labs Lab 08/31/15 1000  LIPASE 22   No results for input(s): AMMONIA in the last 168 hours. CBC:  Recent Labs Lab 08/31/15 1000 08/31/15 2155 09/01/15 0420  WBC 5.9 16.1* 9.9  NEUTROABS 4.9  --   --   HGB 13.8 10.5* 10.3*  HCT 42.2 32.0* 31.3*  MCV 85.4 84.7 86.0  PLT 269 214 204   Cardiac Enzymes:  Recent Labs Lab 08/31/15 2155 09/01/15 0420 09/01/15 0941  TROPONINI <0.03 <0.03 <0.03   BNP: BNP (last 3 results) No results for input(s): BNP in the last 8760 hours.  ProBNP (last 3 results) No results for input(s): PROBNP in the last 8760 hours.  CBG: No results for input(s): GLUCAP in the last 168 hours.

## 2015-09-05 LAB — CULTURE, BLOOD (ROUTINE X 2): Culture: NO GROWTH

## 2016-10-12 IMAGING — CT CT ABD-PELV W/ CM
2 of 5 series · 16 of 46 positions shown, 18 images · IV contrast (APPLIED)
Comparison: None.

CLINICAL DATA: Abdominal pain starting this morning

EXAM:
CT ABDOMEN AND PELVIS WITH CONTRAST
TECHNIQUE: Multidetector CT imaging of the abdomen and pelvis was performed
using the standard protocol following bolus administration of
intravenous contrast.
CONTRAST:  100mL OMNIPAQUE IOHEXOL 300 MG/ML  SOLN

[Series 2: abd/pelvis 5.0 b31f · axial · 0.87mm/px · z∈[-542,-87]mm · 13 of 103 slices shown, 15 images]
[im 6/103  soft-tissue]
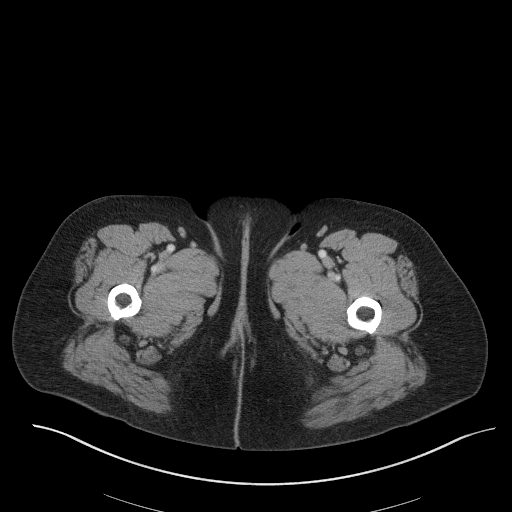
[im 6/103  bone]
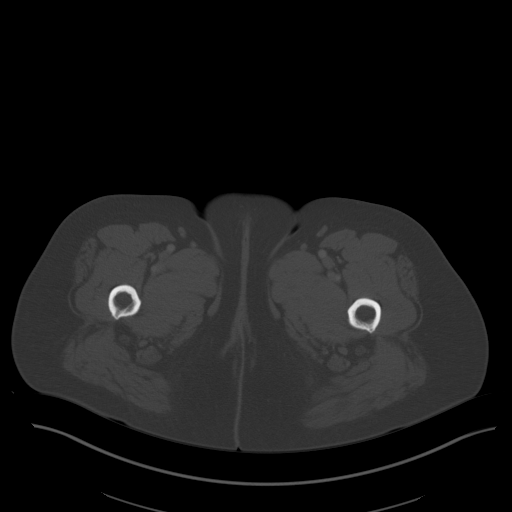
[im 17/103  soft-tissue]
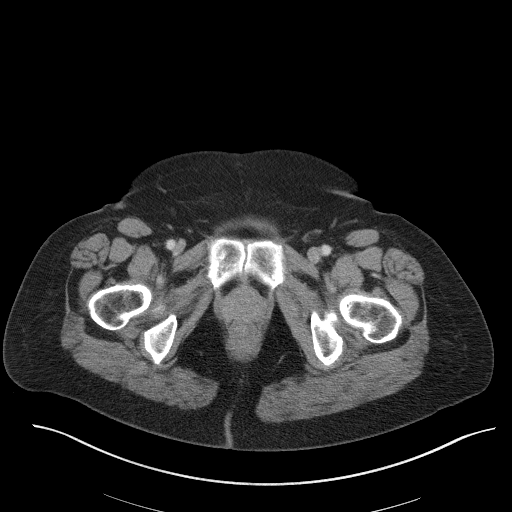
[im 22/103  soft-tissue]
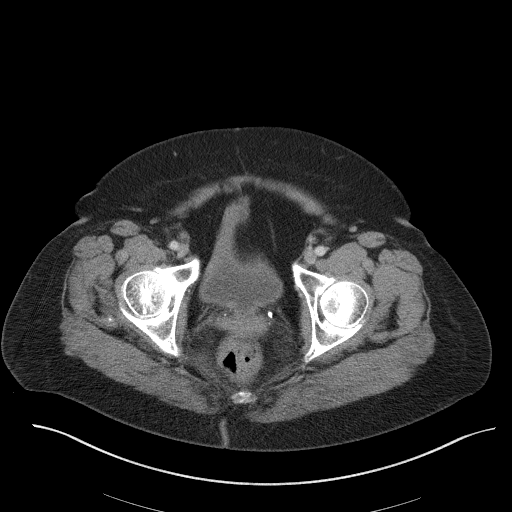
[im 27/103  soft-tissue]
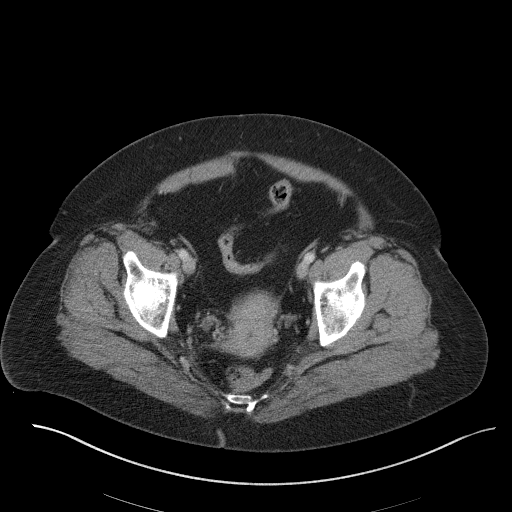
[im 38/103  soft-tissue]
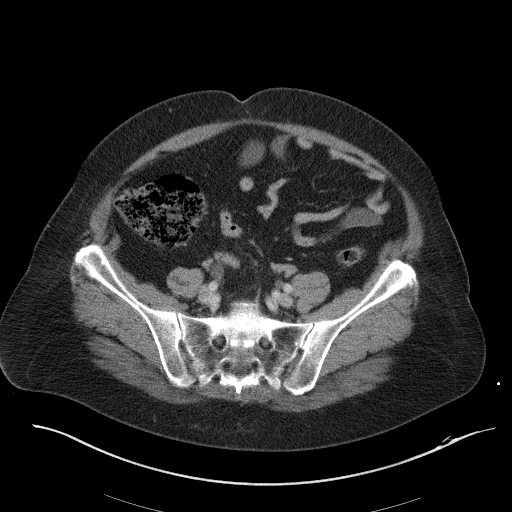
[im 43/103  soft-tissue]
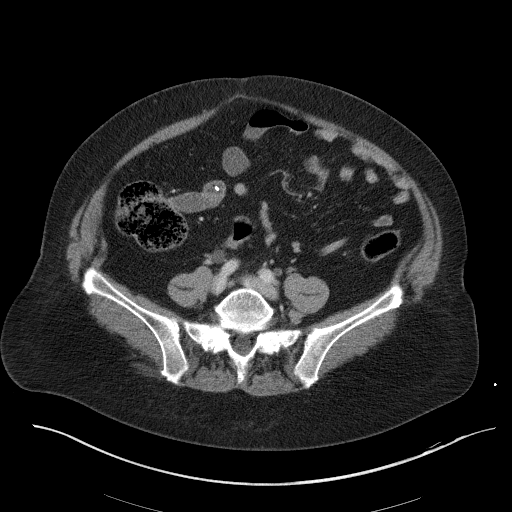
[im 54/103  soft-tissue]
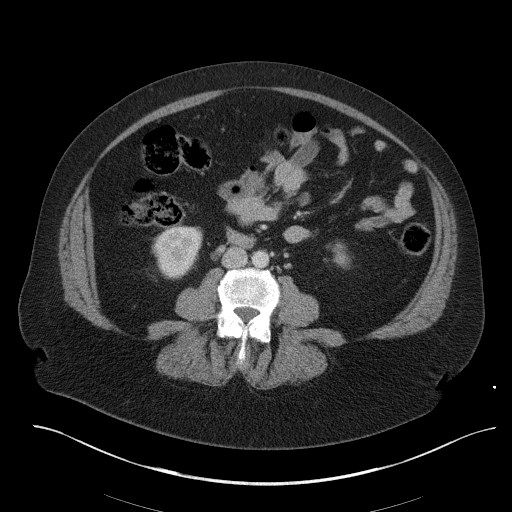
[im 60/103  soft-tissue]
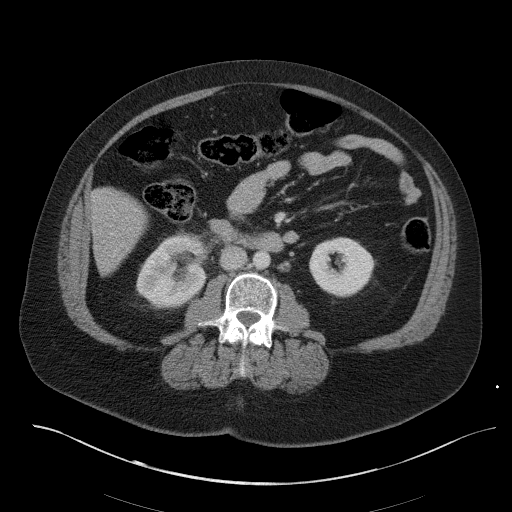
[im 65/103  soft-tissue]
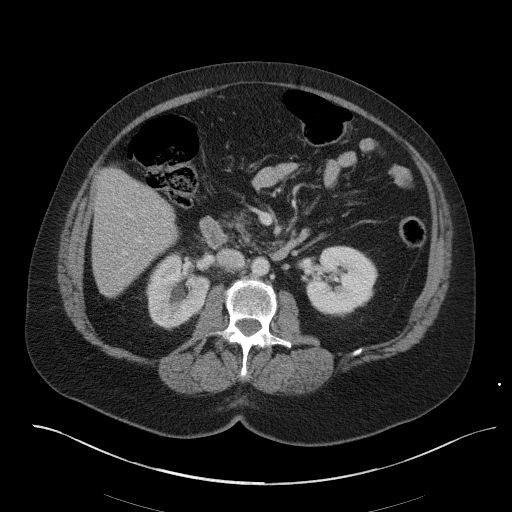
[im 65/103  bone]
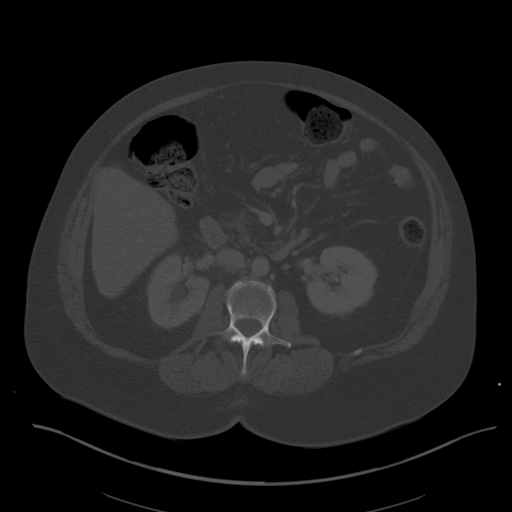
[im 76/103  soft-tissue]
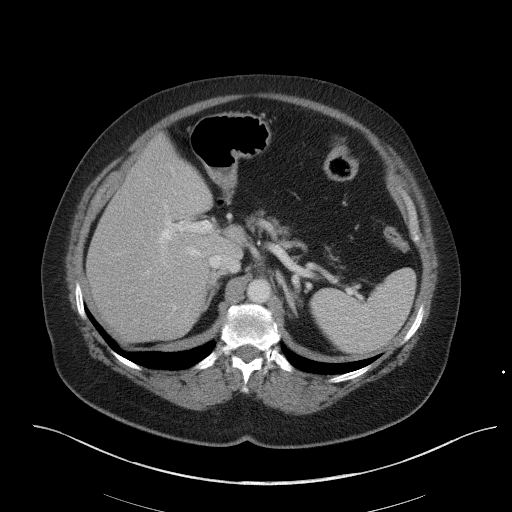
[im 81/103  soft-tissue]
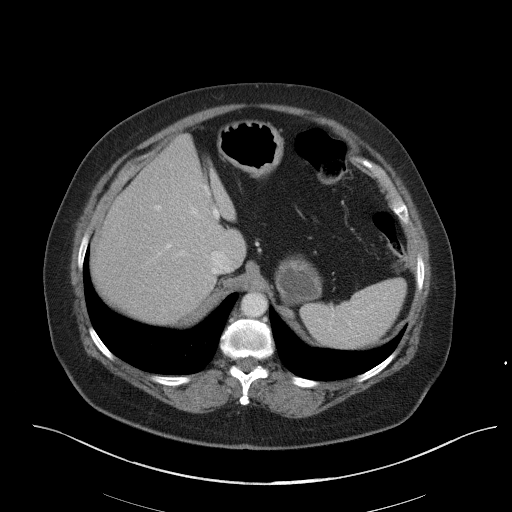
[im 86/103  soft-tissue]
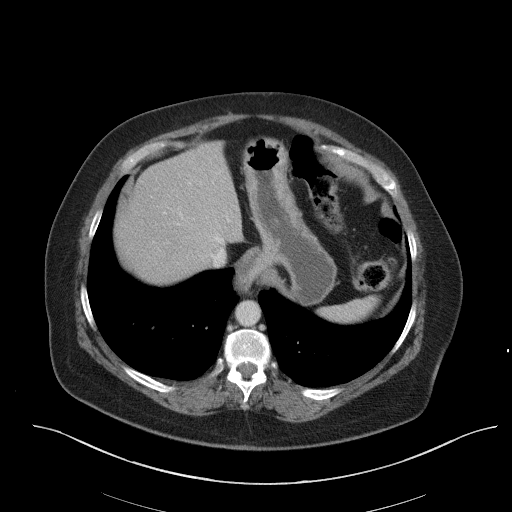
[im 97/103  soft-tissue]
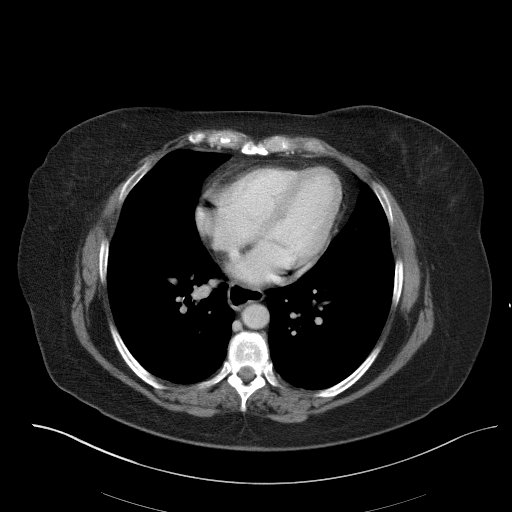

[Series 5: abd/pelvis 3.0 coronal · coronal · 0.92mm/px · 3 of 113 slices shown]
[im 38/113  soft-tissue]
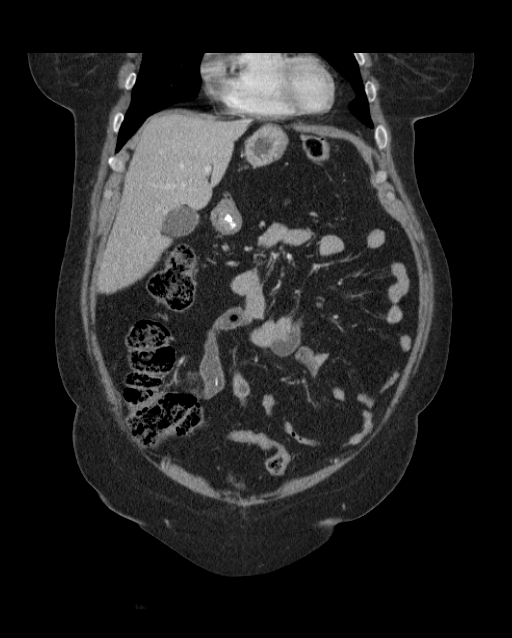
[im 50/113  soft-tissue]
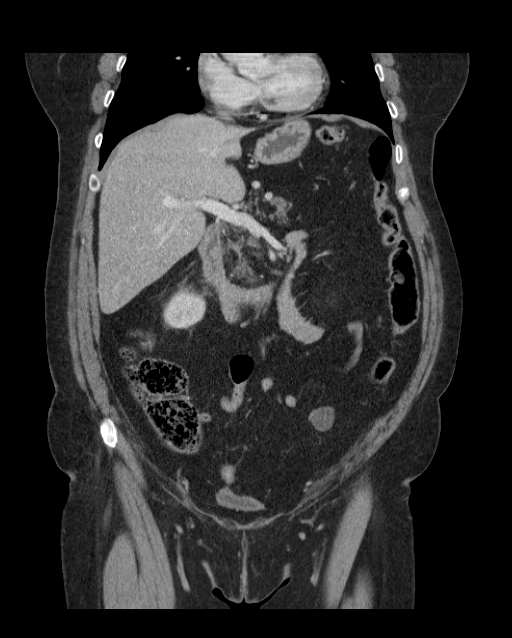
[im 63/113  soft-tissue]
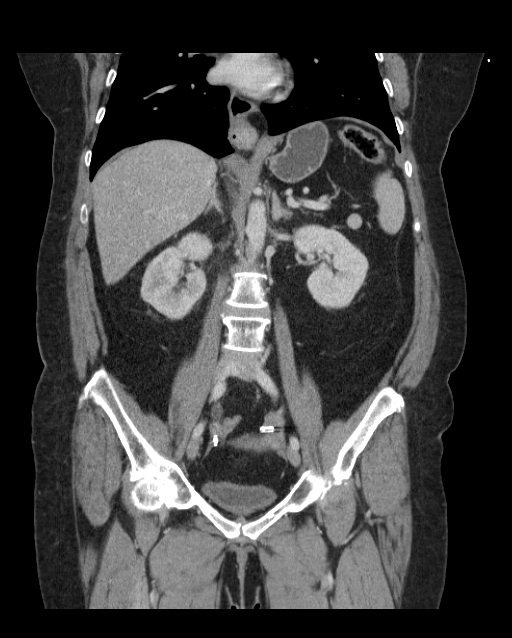

[16 of 46 positions shown; findings below may reference images not displayed]

FINDINGS: Small hiatal hernia is noted.  Lung bases are unremarkable.

Sagittal images of the spine shows mild degenerative changes lower
thoracic spine. Enhanced liver is unremarkable. There is atrophic
pancreas fatty replaced. No aortic aneurysm. The spleen and adrenal
glands are unremarkable.

There is subtle mild right perinephric stranding. Minimal delayed
enhancement and delayed excretion of the right kidney. There is
minimal right hydronephrosis. Mild right hydroureter.

Axial image 80 there is 5 mm calcified obstructive calculus in right
UVJ. Urinary bladder is under distended. Uterus and ovaries are
unremarkable. Post tubal ligation surgical clips are noted. Moderate
stool noted in right colon and cecum. No pericecal inflammation.
Normal appendix. The terminal ileum is unremarkable.

No inguinal adenopathy. No destructive bony lesions are noted within
pelvis.

No small bowel obstruction.  No ascites or free air.  No adenopathy.
IMPRESSION: 1. There is mild right hydronephrosis and right hydroureter.
2. Minimal delayed enhancement and excretion of the right kidney
consistent with obstructive uropathy.
3. There is 5 mm calcified obstructive calculus in right UVJ.
4. Moderate stool noted in right colon and cecum. No pericecal
inflammation. Normal appendix.
5. No small bowel obstruction.
These results were called by telephone at the time of interpretation
on 08/31/2015 at [DATE] to Dr. KAYVIA HNDZ , who verbally
acknowledged these results.

## 2017-11-18 HISTORY — PX: OTHER SURGICAL HISTORY: SHX169

## 2018-11-18 HISTORY — PX: APPENDECTOMY: SHX54

## 2019-04-29 MED ORDER — LACTATED RINGERS IV SOLN
INTRAVENOUS | Status: DC
Start: ? — End: 2019-04-29

## 2019-04-29 MED ORDER — ACETAMINOPHEN 325 MG PO TABS
650.00 | ORAL_TABLET | ORAL | Status: DC
Start: ? — End: 2019-04-29

## 2019-04-29 MED ORDER — PANTOPRAZOLE SODIUM 40 MG PO TBEC
40.00 | DELAYED_RELEASE_TABLET | ORAL | Status: DC
Start: 2019-04-30 — End: 2019-04-29

## 2019-04-29 MED ORDER — INSULIN LISPRO 100 UNIT/ML ~~LOC~~ SOLN
2.00 | SUBCUTANEOUS | Status: DC
Start: 2019-04-30 — End: 2019-04-29

## 2019-04-29 MED ORDER — SIMETHICONE 80 MG PO CHEW
80.00 | CHEWABLE_TABLET | ORAL | Status: DC
Start: ? — End: 2019-04-29

## 2019-04-29 MED ORDER — GENERIC EXTERNAL MEDICATION
1.00 | Status: DC
Start: 2019-04-30 — End: 2019-04-29

## 2019-04-29 MED ORDER — MAGNESIUM OXIDE 400 MG PO TABS
400.00 | ORAL_TABLET | ORAL | Status: DC
Start: 2019-04-29 — End: 2019-04-29

## 2019-04-29 MED ORDER — ONDANSETRON HCL 4 MG/2ML IJ SOLN
4.00 | INTRAMUSCULAR | Status: DC
Start: ? — End: 2019-04-29

## 2019-04-29 MED ORDER — POTASSIUM CHLORIDE CRYS ER 20 MEQ PO TBCR
20.00 | EXTENDED_RELEASE_TABLET | ORAL | Status: DC
Start: 2019-04-30 — End: 2019-04-29

## 2019-04-29 MED ORDER — GLUCAGON HCL RDNA (DIAGNOSTIC) 1 MG IJ SOLR
1.00 | INTRAMUSCULAR | Status: DC
Start: ? — End: 2019-04-29

## 2019-04-29 MED ORDER — ENOXAPARIN SODIUM 40 MG/0.4ML ~~LOC~~ SOLN
40.00 | SUBCUTANEOUS | Status: DC
Start: 2019-04-30 — End: 2019-04-29

## 2019-04-29 MED ORDER — PSYLLIUM 51.7 % PO PACK
1.00 | PACK | ORAL | Status: DC
Start: 2019-04-29 — End: 2019-04-29

## 2019-04-29 MED ORDER — GLUCOSE 40 % PO GEL
15.00 | ORAL | Status: DC
Start: ? — End: 2019-04-29

## 2019-04-29 MED ORDER — ALUMINUM-MAGNESIUM-SIMETHICONE 200-200-20 MG/5ML PO SUSP
30.00 | ORAL | Status: DC
Start: ? — End: 2019-04-29

## 2019-04-29 MED ORDER — PHENOL 1.4 % MT LIQD
1.00 | OROMUCOSAL | Status: DC
Start: ? — End: 2019-04-29

## 2019-04-29 MED ORDER — LOSARTAN POTASSIUM 25 MG PO TABS
25.00 | ORAL_TABLET | ORAL | Status: DC
Start: 2019-04-30 — End: 2019-04-29

## 2019-04-29 MED ORDER — KETOROLAC TROMETHAMINE 30 MG/ML IJ SOLN
15.00 | INTRAMUSCULAR | Status: DC
Start: ? — End: 2019-04-29

## 2019-04-29 MED ORDER — DEXTROSE 10 % IV SOLN
125.00 | INTRAVENOUS | Status: DC
Start: ? — End: 2019-04-29

## 2021-09-04 LAB — HM COLONOSCOPY

## 2022-11-28 LAB — HM DIABETES EYE EXAM

## 2022-12-04 ENCOUNTER — Other Ambulatory Visit (HOSPITAL_BASED_OUTPATIENT_CLINIC_OR_DEPARTMENT_OTHER): Payer: Self-pay

## 2022-12-04 ENCOUNTER — Ambulatory Visit: Payer: BC Managed Care – PPO | Admitting: Family

## 2022-12-04 ENCOUNTER — Encounter: Payer: Self-pay | Admitting: Family

## 2022-12-04 VITALS — BP 138/78 | HR 96 | Temp 97.9°F | Resp 18 | Ht 65.0 in | Wt 200.6 lb

## 2022-12-04 DIAGNOSIS — G5603 Carpal tunnel syndrome, bilateral upper limbs: Secondary | ICD-10-CM

## 2022-12-04 DIAGNOSIS — Z1159 Encounter for screening for other viral diseases: Secondary | ICD-10-CM

## 2022-12-04 DIAGNOSIS — M8580 Other specified disorders of bone density and structure, unspecified site: Secondary | ICD-10-CM | POA: Diagnosis not present

## 2022-12-04 DIAGNOSIS — G500 Trigeminal neuralgia: Secondary | ICD-10-CM

## 2022-12-04 DIAGNOSIS — N179 Acute kidney failure, unspecified: Secondary | ICD-10-CM

## 2022-12-04 DIAGNOSIS — D649 Anemia, unspecified: Secondary | ICD-10-CM

## 2022-12-04 DIAGNOSIS — K219 Gastro-esophageal reflux disease without esophagitis: Secondary | ICD-10-CM

## 2022-12-04 DIAGNOSIS — G4733 Obstructive sleep apnea (adult) (pediatric): Secondary | ICD-10-CM

## 2022-12-04 DIAGNOSIS — L509 Urticaria, unspecified: Secondary | ICD-10-CM

## 2022-12-04 DIAGNOSIS — E119 Type 2 diabetes mellitus without complications: Secondary | ICD-10-CM | POA: Insufficient documentation

## 2022-12-04 DIAGNOSIS — E041 Nontoxic single thyroid nodule: Secondary | ICD-10-CM

## 2022-12-04 DIAGNOSIS — I10 Essential (primary) hypertension: Secondary | ICD-10-CM

## 2022-12-04 DIAGNOSIS — E876 Hypokalemia: Secondary | ICD-10-CM

## 2022-12-04 DIAGNOSIS — T7840XA Allergy, unspecified, initial encounter: Secondary | ICD-10-CM

## 2022-12-04 DIAGNOSIS — E785 Hyperlipidemia, unspecified: Secondary | ICD-10-CM

## 2022-12-04 DIAGNOSIS — E559 Vitamin D deficiency, unspecified: Secondary | ICD-10-CM

## 2022-12-04 DIAGNOSIS — E213 Hyperparathyroidism, unspecified: Secondary | ICD-10-CM | POA: Insufficient documentation

## 2022-12-04 DIAGNOSIS — Z87442 Personal history of urinary calculi: Secondary | ICD-10-CM | POA: Insufficient documentation

## 2022-12-04 HISTORY — DX: Type 2 diabetes mellitus without complications: E11.9

## 2022-12-04 LAB — HEMOGLOBIN A1C: Hgb A1c MFr Bld: 6.6 % — ABNORMAL HIGH (ref 4.6–6.5)

## 2022-12-04 LAB — COMPREHENSIVE METABOLIC PANEL
ALT: 24 U/L (ref 0–35)
AST: 20 U/L (ref 0–37)
Albumin: 4.4 g/dL (ref 3.5–5.2)
Alkaline Phosphatase: 71 U/L (ref 39–117)
BUN: 14 mg/dL (ref 6–23)
CO2: 29 mEq/L (ref 19–32)
Calcium: 9.4 mg/dL (ref 8.4–10.5)
Chloride: 100 mEq/L (ref 96–112)
Creatinine, Ser: 0.91 mg/dL (ref 0.40–1.20)
GFR: 64.68 mL/min (ref >60.00)
Glucose, Bld: 106 mg/dL — ABNORMAL HIGH (ref 70–99)
Potassium: 3.8 mEq/L (ref 3.5–5.1)
Sodium: 139 mEq/L (ref 135–145)
Total Bilirubin: 1.1 mg/dL (ref 0.2–1.2)
Total Protein: 7.2 g/dL (ref 6.0–8.3)

## 2022-12-04 LAB — MICROALBUMIN / CREATININE URINE RATIO
Creatinine,U: 74.6 mg/dL
Microalb Creat Ratio: 0.9 mg/g (ref 0.0–30.0)
Microalb, Ur: <0.7 mg/dL (ref 0.0–1.9)

## 2022-12-04 LAB — VITAMIN D 25 HYDROXY (VIT D DEFICIENCY, FRACTURES): VITD: 24.41 ng/mL — ABNORMAL LOW (ref 30.00–100.00)

## 2022-12-04 MED ORDER — PANTOPRAZOLE SODIUM 40 MG PO TBEC
40.0000 mg | DELAYED_RELEASE_TABLET | Freq: Two times a day (BID) | ORAL | 1 refills | Status: DC
  Filled 2022-12-04: qty 180, 90d supply, fill #0
  Filled 2023-03-08: qty 180, 90d supply, fill #1

## 2022-12-04 MED ORDER — SYNJARDY XR 25-1000 MG PO TB24
1.0000 | ORAL_TABLET | Freq: Every day | ORAL | 5 refills | Status: DC
  Filled 2022-12-04: qty 30, 30d supply, fill #0
  Filled 2023-01-08: qty 30, 30d supply, fill #1
  Filled 2023-02-18: qty 30, 30d supply, fill #2
  Filled 2023-03-08 – 2023-03-13 (×2): qty 30, 30d supply, fill #3

## 2022-12-04 MED ORDER — VITAMIN D3 25 MCG (1000 UT) PO CAPS: 1000.0000 [IU] | ORAL_CAPSULE | Freq: Every day | ORAL | Status: AC

## 2022-12-04 MED ORDER — GABAPENTIN 300 MG PO CAPS
300.0000 mg | ORAL_CAPSULE | Freq: Three times a day (TID) | ORAL | 1 refills | Status: DC
  Filled 2022-12-04 – 2022-12-10 (×3): qty 270, 90d supply, fill #0
  Filled 2023-03-08: qty 270, 90d supply, fill #1

## 2022-12-04 MED ORDER — VALSARTAN-HYDROCHLOROTHIAZIDE 160-25 MG PO TABS
1.0000 | ORAL_TABLET | Freq: Every morning | ORAL | 1 refills | Status: DC
  Filled 2022-12-04: qty 90, 90d supply, fill #0
  Filled 2023-03-08: qty 90, 90d supply, fill #1

## 2022-12-04 MED ORDER — PRAVASTATIN SODIUM 40 MG PO TABS
40.0000 mg | ORAL_TABLET | Freq: Every day | ORAL | 1 refills | Status: DC
  Filled 2022-12-04: qty 90, 90d supply, fill #0
  Filled 2023-03-08: qty 90, 90d supply, fill #1

## 2022-12-04 MED ORDER — POLY-IRON 150 FORTE 150-25-1 MG-MCG-MG PO CAPS: 1.0000 | ORAL_CAPSULE | Freq: Every day | ORAL | 0 refills | Status: DC

## 2022-12-04 MED ORDER — PREDNISONE 10 MG PO TABS
ORAL_TABLET | ORAL | 0 refills | Status: AC
  Filled 2022-12-04: qty 20, 8d supply, fill #0

## 2022-12-04 MED ORDER — POTASSIUM CHLORIDE CRYS ER 10 MEQ PO TBCR
10.0000 meq | EXTENDED_RELEASE_TABLET | Freq: Two times a day (BID) | ORAL | 1 refills | Status: DC
  Filled 2022-12-04: qty 180, 90d supply, fill #0
  Filled 2023-03-08: qty 180, 90d supply, fill #1

## 2022-12-04 NOTE — Assessment & Plan Note (Signed)
Last ldl 106- continue diet, pravastatin. Repeat lipids next visit.

## 2022-12-04 NOTE — Assessment & Plan Note (Signed)
She is s/p parathyroidectomy.

## 2022-12-04 NOTE — Assessment & Plan Note (Signed)
Last A1C was 6.2.  On Synjardy.

## 2022-12-04 NOTE — Assessment & Plan Note (Signed)
Reports that this occurred in 2016 and she was hospitalized.

## 2022-12-04 NOTE — Assessment & Plan Note (Signed)
It appears that this was not removed during her parathyroidectomy based on notes reviewed in care everywhere.

## 2022-12-04 NOTE — Assessment & Plan Note (Signed)
Has some seasonal allergies that she manages with otc anti-histamines.

## 2022-12-04 NOTE — Assessment & Plan Note (Signed)
On poly iron, last cbc was stable 10/23.

## 2022-12-04 NOTE — Assessment & Plan Note (Signed)
BP Readings from Last 3 Encounters:  12/04/22 138/78  09/03/15 (!) 148/92  06/20/14 (!) 147/84   Stable on diovan hct, continue same.

## 2022-12-04 NOTE — Assessment & Plan Note (Signed)
Last bone density 11/22- osteopenia.

## 2022-12-04 NOTE — Assessment & Plan Note (Signed)
Reports occasional finger numbness, has not been bad enough to seek specialty care for it.

## 2022-12-04 NOTE — Progress Notes (Signed)
Subjective:   By signing my name below, I, Andrea Melton, attest that this documentation has been prepared under the direction and in the presence of Sandford Craze, NP. 12/04/2022   Patient ID: Andrea Melton, female    DOB: March 02, 1956, 67 y.o.   MRN: [id number]  Chief Complaint  Patient presents with   Establish Care    HPI Patient is in today for a new patient visit.   Hives: She complains of a hive breakout on her right arm since 11/30/2021. She has itching. She is taking benadryl to manage her symptoms but continues having itching. She has a history of hives. When she was last diagnosed, she reports they were caused by stress. She was given allergy medication from her last provider to manage her symptoms. She is interested in seeing a allergy specialist to manage her symptoms and further evaluation.   Kidney stone: She has a history of kidney stones. Her last kidney stone caused a blockage in her kidney and she needed to be hospitalized in 2014. She has no episodes of kidney stones since.   Cholesterol: Her last LDL was slightly above normal.  Lab Results  Component Value Date   CHOL 135 09/01/2015   HDL 60 09/01/2015   LDLCALC 67 09/01/2015   TRIG 41 09/01/2015   CHOLHDL 2.3 09/01/2015   Blood pressure: Her blood pressure is doing well during this visit while taking 160-25 mg Diovan-HCT daily PO and reports no new issues while taking it.  BP Readings from Last 3 Encounters:  12/04/22 138/78  09/03/15 (!) 148/92  06/20/14 (!) 147/84   Pulse Readings from Last 3 Encounters:  12/04/22 96  09/03/15 93  06/20/14 84   Sleep apnea: She has a history of sleep apnea. She has tried a sleep mask in the past but could no fall asleep while wearing it. She has never tried a sleeping mouth guard.   Bone density: She reports having a bone density scan in early November 2022.   Hip replacement: She had a right hip replacement procedure in 2019. She reports it healed well and  has no new issues with it.   trigeminal neuralgia: She has a history of trigeminal neuralgia. She is taking 100 mg gabapentin 2x daily PO. She has a had 2 procedures in the past to manage her symptoms and reports her last one brought her relief. She continues having pain at this time.   Colonoscopy: She is due for colonoscopy and is planning on scheduling an appointment with her GI specialist.   Sepsis: She had sepsis while in the hospital with her last kidney stone and when she had her appendectomy.   Hypoparathyroidism: She has a history of hypoparathyroidism. She had a high calcium reading and seen an endocrinologist specialist for further evaluation. They found she had a nodule on her parathyroid and then removed it.    Environmental allergies: She has a history of environmental allergies. She gets mild congestion when the seasons change and usually takes antihistamine to manage her symptoms.   Anemia: She has a history of anemia. She had blood work a couple months ago and found no anemia. She takes iron supplements daily.   Carpal tunnel: She has a history of carpal tunnel but does not manage it at this time. Her fingers occasionally have numbness. She has never seen a specialist in the past.  Diabetes: She has a history of diabetes. She is managing it through taking 25 mg Syndardy XR daily PO and  diet.  Lab Results  Component Value Date   HGBA1C 6.5 (H) 08/31/2015   Social history: She is married with a female partner and has 3 daughters. She has 3 pet cats. She is living at home with her husband and 2 daughters. She has a history of smoking tobacco products but stopped smoking in 1976.   Vitamin D: She has a history of vitamin D supplements at is taking 1000 unit supplement daily.   Immunization: She is not interested in hepatitis C screening during her next blood work. She does not have the pneumonia vaccine and is not interested in receiving it. She has the latest flu vaccine.     Past Medical History:  Diagnosis Date   Acute renal failure (ARF) ([place])    Allergy    Anemia    Bilateral carpal tunnel syndrome    Colon polyp    Diabetes type 2, controlled ([place]) 12/04/2022   GERD (gastroesophageal reflux disease)    Hydronephrosis of right kidney    Hyperlipidemia    Hyperparathyroidism ([place])    Hypertension    Kidney stone    Left thyroid nodule    OSA (obstructive sleep apnea)    Osteoarthritis    Osteopenia    Sepsis due to Gram negative bacteria ([place]) 09/01/2015   Trigeminal neuralgia    Urticaria    Vitamin D deficiency     Past Surgical History:  Procedure Laterality Date   APPENDECTOMY  2020   [facility]   FACIAL NERVE SURGERY  11/18/1998   [facility]   PARATHYROIDECTOMY     2022   right total hip Right 2019    Family History  Problem Relation Age of Onset   Hypertension Mother    Stroke Mother    Heart disease Father    Hypertension Father    Stroke Father    Depression Maternal Grandmother    Hypertension Brother    Kidney disease Brother        Kidney Stones    Cancer Sister        Breast/Uterine    Hypertension Sister    Kidney disease Sister        Kidney Stone    Cancer Sister        Breast    Hypertension Sister     Social History   Socioeconomic History   Marital status: Married    Spouse name: [name]    Number of children: 3   Years of education: 16   Highest education level: Not on file  Occupational History   Occupation: ASSISTANT DIRECTOR     Employer: OTHER    Comment: [employer]   Tobacco Use   Smoking status: Former    Packs/day: 0.25    Years: 5.00    Total pack years: 1.25    Types: Cigarettes    Quit date: 11/18/1968    Years since quitting: 54.0   Smokeless tobacco: Never  Substance and Sexual Activity   Alcohol use: Yes    Alcohol/week: 2.0 standard drinks of alcohol    Types: 2 Glasses of wine per week    Comment: Twice Weekly    Drug use: No   Sexual activity: Yes     Partners: Male  Other Topics Concern   Not on file  Social History Narrative   Marital Status:  Married [name])    Children:  Daughters (3)    Pets: Cats (3)    Living Situation: Lives with  husband and 2 daughters   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    Alcohol Use:  Twice weekly (2 glasses of wine)    Drug Use:  None   Diet:  RegularExercise:  Cardio and weights three times weekly   Hobbies:  Sewing, Reading and gardening       Social Determinants of Health   Financial Resource Strain: Not on file  Food Insecurity: Not on file  Transportation Needs: Not on file  Physical Activity: Not on file  Stress: Not on file  Social Connections: Not on file  Intimate Partner Violence: Not on file    Outpatient Medications Prior to Visit  Medication Sig Dispense Refill   magnesium oxide (MAG-OX) 400 MG tablet Take 400 mg by mouth daily.     pantoprazole (PROTONIX) 40 MG tablet Take 40 mg by mouth 2 (two) times daily.     potassium chloride (KLOR-CON M10) 10 MEQ tablet Take 10 mEq by mouth 2 (two) times daily.     pravastatin (PRAVACHOL) 40 MG tablet Take 40 mg by mouth daily.     SYNJARDY XR 25-1000 MG TB24 Take 1 tablet by mouth daily.     valsartan-hydrochlorothiazide (DIOVAN-HCT) 160-25 MG tablet Take 1 tablet by mouth every morning.     aspirin EC 81 MG tablet Take 81 mg by mouth daily.     B Complex-C (B-COMPLEX WITH VITAMIN C) tablet Take 1 tablet by mouth daily.     cyproheptadine (PERIACTIN) 4 MG tablet PRN for hives.     furosemide (LASIX) 40 MG tablet Take 40 mg by mouth daily.     gabapentin (NEURONTIN) 100 MG capsule Take 100 mg by mouth 2 (two) times daily.      gabapentin (NEURONTIN) 300 MG capsule Take 300 mg by mouth 3 (three) times daily.     losartan (COZAAR) 100 MG tablet Take 100  mg by mouth 2 (two) times daily.     pantoprazole (PROTONIX) 40 MG tablet Take 40 mg by mouth daily.     potassium chloride (K-DUR) 10 MEQ tablet Take 1 tablet by mouth daily.     pravastatin (PRAVACHOL) 20 MG tablet Take 1 tablet by mouth daily.     ranitidine (ZANTAC) 150 MG tablet Take 150 mg by mouth 2 (two) times daily.     sulfamethoxazole-trimethoprim (BACTRIM DS,SEPTRA DS) 800-160 MG tablet Take 1 tablet by mouth 2 (two) times daily. 20 tablet 0   tamsulosin (FLOMAX) 0.4 MG CAPS capsule Take 1 capsule (0.4 mg total) by mouth daily. 30 capsule 0   Vitamin D, Ergocalciferol, (DRISDOL) 50000 UNITS CAPS capsule Take 1 capsule by mouth 3 (three) times a week.     No facility-administered medications prior to visit.    Allergies  Allergen Reactions   Carbamazepine Hives   Dilantin [Phenytoin Sodium Extended] Hives and Swelling    Review of Systems  Skin:        (+)hives breakout on right arm       Objective:    Physical Exam Constitutional:      General: She is not in acute distress.    Appearance: Normal appearance. She is not ill-appearing.  HENT:     Head: Normocephalic and atraumatic.     Right Ear: External ear normal.     Left Ear: External  ear normal.  Eyes:     Extraocular Movements: Extraocular movements intact.     Pupils: Pupils are equal, round, and reactive to light.  Cardiovascular:     Rate and Rhythm: Normal rate and regular rhythm.     Heart sounds: Normal heart sounds. No murmur heard.    No gallop.  Pulmonary:     Effort: Pulmonary effort is normal. No respiratory distress.     Breath sounds: Normal breath sounds. No wheezing or rales.  Skin:    General: Skin is warm and dry.  Neurological:     Mental Status: She is alert and oriented to person, place, and time.  Psychiatric:        Judgment: Judgment normal.     BP 138/78   Pulse 96   Temp 97.9 F (36.6 C)   Resp 18   Ht 5\' 5"  (1.651 m)   Wt 200 lb 9.6 oz (91 kg)   SpO2 99%   BMI  33.38 kg/m  Wt Readings from Last 3 Encounters:  12/04/22 200 lb 9.6 oz (91 kg)  08/31/15 207 lb 14.4 oz (94.3 kg)  06/20/14 214 lb (97.1 kg)       Assessment & Plan:  Bilateral carpal tunnel syndrome Assessment & Plan: Reports occasional finger numbness, has not been bad enough to seek specialty care for it.    Vitamin D deficiency -     VITAMIN D 25 Hydroxy (Vit-D Deficiency, Fractures)  Osteopenia, unspecified location Assessment & Plan: Last bone density 11/22- osteopenia.    OSA (obstructive sleep apnea) Assessment & Plan: She did not tolerate cpap.     Left thyroid nodule Assessment & Plan: It appears that this was not removed during her parathyroidectomy based on notes reviewed in care everywhere.    Personal history of kidney stones  Allergy, initial encounter Assessment & Plan: Has some seasonal allergies that she manages with otc anti-histamines.    Hyperparathyroidism [facility]) Assessment & Plan: She is s/p parathyroidectomy.   Orders: -     Parathyroid hormone, intact (no Ca)  Urticaria Assessment & Plan: Recurrent issue.    Orders: -     predniSONE; 4 tablets (40 mg total) daily for 2 days, THEN 3 tablets (30 mg total) daily for 2 days, THEN 2 tablets (20 mg total) daily for 2 days, THEN 1 tablet (10 mg total) daily for 2 days.  Dispense: 20 tablet; Refill: 0 -     Ambulatory referral to Allergy  Acute renal failure, unspecified acute renal failure type [facility]) Assessment & Plan: Reports that this occurred in 2016 and she was hospitalized.    Anemia, unspecified type Assessment & Plan: On poly iron, last cbc was stable 10/23.    Controlled type 2 diabetes mellitus without complication, without long-term current use of insulin (HCC) Assessment & Plan: Last A1C was 6.2.  On Synjardy.    Orders: -     Hemoglobin A1c -     Comprehensive metabolic panel -     Microalbumin / creatinine urine ratio -     Synjardy XR; Take 1 tablet by mouth  daily.  Dispense: 30 tablet; Refill: 5  Hyperlipidemia, unspecified hyperlipidemia type Assessment & Plan: Last ldl 106- continue diet, pravastatin. Repeat lipids next visit.   Orders: -     Pravastatin Sodium; Take 1 tablet (40 mg total) by mouth daily.  Dispense: 90 tablet; Refill: 1  Hypertension, unspecified type Assessment & Plan: BP Readings from Last 3 Encounters:  12/04/22  138/78  09/03/15 (!) 148/92  06/20/14 (!) 147/84   Stable on diovan hct, continue same.   Orders: -     Valsartan-hydroCHLOROthiazide; Take 1 tablet by mouth every morning.  Dispense: 90 tablet; Refill: 1  Hypokalemia Assessment & Plan: Clinically stable on potassium supplement.   Orders: -     Potassium Chloride Crys ER; Take 1 tablet (10 mEq total) by mouth 2 (two) times daily.  Dispense: 180 tablet; Refill: 1  Trigeminal neuralgia Assessment & Plan: She has had since she was a child. Only occurs on the right side. Had gamma knife surgery 2022 which was helpful. She continues gabapentin.  Orders: -     Gabapentin; Take 1 capsule (300 mg total) by mouth 3 (three) times daily.  Dispense: 270 capsule; Refill: 1  Encounter for hepatitis C screening test for low risk patient -     Hepatitis C antibody  Gastroesophageal reflux disease, unspecified whether esophagitis present -     Pantoprazole Sodium; Take 1 tablet (40 mg total) by mouth 2 (two) times daily.  Dispense: 180 tablet; Refill: 1  Other orders -     Vitamin D3; Take 1 capsule (1,000 Units total) by mouth daily.  Dispense: 60 capsule -     Poly-Iron 150 Forte; Take 1 Capful by mouth daily.; Refill: 0   55 minutes spent on today's visit.  Time was spent interviewing patient and reviewing outside records.   I, Nance Pear, NP, personally preformed the services described in this documentation.  All medical record entries made by the scribe were at my direction and in my presence.  I have reviewed the chart and discharge  instructions (if applicable) and agree that the record reflects my personal performance and is accurate and complete. 12/04/2022   I,Andrea Melton,acting as a scribe for Nance Pear, NP.,have documented all relevant documentation on the behalf of Nance Pear, NP,as directed by  Nance Pear, NP while in the presence of Nance Pear, NP.   Nance Pear, NP

## 2022-12-04 NOTE — Assessment & Plan Note (Signed)
Clinically stable on potassium supplement.

## 2022-12-04 NOTE — Assessment & Plan Note (Signed)
She did not tolerate cpap.

## 2022-12-04 NOTE — Assessment & Plan Note (Signed)
She has had since she was a child. Only occurs on the right side. Had gamma knife surgery 2022 which was helpful. She continues gabapentin.

## 2022-12-04 NOTE — Assessment & Plan Note (Signed)
Recurrent issue.

## 2022-12-05 LAB — HEPATITIS C ANTIBODY: Hepatitis C Ab: NONREACTIVE

## 2022-12-05 LAB — PARATHYROID HORMONE, INTACT (NO CA): PTH: 64 pg/mL (ref 16–77)

## 2022-12-06 ENCOUNTER — Telehealth: Payer: Self-pay | Admitting: Family

## 2022-12-06 DIAGNOSIS — E559 Vitamin D deficiency, unspecified: Secondary | ICD-10-CM

## 2022-12-06 MED ORDER — VITAMIN D (ERGOCALCIFEROL) 1.25 MG (50000 UNIT) PO CAPS: 50000.0000 [IU] | ORAL_CAPSULE | ORAL | 0 refills | Status: DC

## 2022-12-06 NOTE — Telephone Encounter (Signed)
Patient called back and advised of results, new rx and provider's advsie.

## 2022-12-06 NOTE — Telephone Encounter (Signed)
Called but no answer, lvm for patient to call back for results  

## 2022-12-06 NOTE — Telephone Encounter (Signed)
Vitamin D level is low.  Advise patient to begin vit D 50000 units once weekly for 12 weeks, then repeat vit D level (dx Vit D deficiency).    A1C is about the same as last time at 6.6.   Kidney function and electrolytes are normal.

## 2022-12-10 ENCOUNTER — Other Ambulatory Visit (HOSPITAL_BASED_OUTPATIENT_CLINIC_OR_DEPARTMENT_OTHER): Payer: Self-pay

## 2022-12-25 ENCOUNTER — Other Ambulatory Visit: Payer: Self-pay | Admitting: Family

## 2022-12-25 ENCOUNTER — Encounter: Payer: Self-pay | Admitting: Internal Medicine

## 2022-12-25 ENCOUNTER — Ambulatory Visit: Payer: BC Managed Care – PPO | Admitting: Internal Medicine

## 2022-12-25 VITALS — BP 132/86 | HR 81 | Temp 98.0°F | Resp 18 | Ht 64.5 in | Wt 200.0 lb

## 2022-12-25 DIAGNOSIS — L501 Idiopathic urticaria: Secondary | ICD-10-CM | POA: Diagnosis not present

## 2022-12-25 DIAGNOSIS — Z889 Allergy status to unspecified drugs, medicaments and biological substances status: Secondary | ICD-10-CM | POA: Diagnosis not present

## 2022-12-25 NOTE — Patient Instructions (Addendum)
Chronic Idiopathic Urticaria: - this is defined as hives lasting more than 6 weeks without an identifiable trigger - hives can be from a number of different sources including infections, allergies, vibration, temperature, pressure among many others other possible causes - often an identifiable cause is not determined - some potential triggers include: stress, illness, NSAIDs, aspirin, hormonal changes - you do not have any red flag symptoms to make [place] concerned about secondary causes of hives, but we will screen for these for reassurance with: CBC w diff, CMP, tryptase, TSH, hive panel, alpha-gal panel, inflammatory markers - approximately 50% of patients with chronic hives can have some associated swelling of the face/lips/eyelids (this is not a cause for alarm and does not typically progress onto systemic allergic reactions)  Therapy Plan:  - For acute flare depo medrol 40mg  IM given today  - start Allegra (fexofenadine)  180mg  zyrtec in AM and  (cetirizine) 10mg  once daily  -can buy generic on amaz[employer] for cheaper   - Sample of xyzal given today, can use until above is purchase.  - if hives are uncontrolled, increase Allegra 180mg  to 2 tabs in AM and zyrtec to 2 tabs in PM - this is maximum dose - can increase or decrease dosing depending on symptom control to a maximum dose of 4 tablets of antihistamine daily. Wait until hives free for at least one month prior to decreasing dose.   - if hives are still uncontrolled with the above regimen, please arrange an appointment for discussion of Xolair (omalizumab)- an injectable medication for hives      Follow up: 4 weeks   Thank you so much for letting me partake in your care today.  Don't hesitate to reach out if you have any additional concerns!  Roney Marion, MD  [facility] [place], [place]

## 2022-12-25 NOTE — Progress Notes (Signed)
New Patient Note  RE: Andrea Melton MRN: [id number] DOB: 1955/11/29 Date of Office Visit: 12/25/2022  Consult requested by: Andrea Alar, NP Primary care provider: Debbrah Alar, NP  Chief Complaint: Urticaria  History of Present Illness: I had the pleasure of seeing Andrea Melton for initial evaluation at the [facility] [place] on 12/25/2022. She is a 67 y.o. female, who is referred here by Andrea Alar, NP for the evaluation of urticaria.  History obtained from patient, chart review and  husband, Andrea Melton.  .  1 month history of daily intermittent urticaria.  Will respond to benadryl.  Cold temperatures seem to help.  Hot showers will worsen symptoms.  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  She did have URI in December prior to onset of symptoms.  Has not tried preventative antihistamines.   Denies any associated food, medication or contact exposure.  Denies any association with sun, cold, heat, exercise, pressure, water or vibration exposure.  Lesions are not painful or fixed.  Denies any associated fevers or arthritis.   Assessment and Plan: Andrea Melton is a 67 y.o. female with: Idiopathic urticaria - Plan: CBC With Diff/Platelet, CMP14+EGFR, C-reactive protein, Alpha-Gal Panel, Allergens w/Total IgE Area 2, Tryptase, Chronic Urticaria, TSH, Thyroid antibodies, CANCELED: Alpha-1 antitrypsin phenotype  History of allergy to multiple drugs   Plan: Patient Instructions  Chronic Idiopathic Urticaria: - this is defined as hives lasting more than 6 weeks without an identifiable trigger - hives can be from a number of different sources including infections, allergies, vibration, temperature, pressure among many others other possible causes - often an identifiable cause is not determined - some potential triggers include: stress, illness, NSAIDs, aspirin, hormonal changes - you do not have any red flag symptoms to make [place] concerned about secondary  causes of hives, but we will screen for these for reassurance with: CBC w diff, CMP, tryptase, TSH, hive panel, alpha-gal panel, inflammatory markers - approximately 50% of patients with chronic hives can have some associated swelling of the face/lips/eyelids (this is not a cause for alarm and does not typically progress onto systemic allergic reactions)  Therapy Plan:  - For acute flare depo medrol 40mg  IM given today  - start Allegra (fexofenadine)  180mg  zyrtec in AM and  (cetirizine) 10mg  once daily  -can buy generic on amazon for cheaper   - Sample of xyzal given today, can use until above is purchase.  - if hives are uncontrolled, increase Allegra 180mg  to 2 tabs in AM and zyrtec to 2 tabs in PM - this is maximum dose - can increase or decrease dosing depending on symptom control to a maximum dose of 4 tablets of antihistamine daily. Wait until hives free for at least one month prior to decreasing dose.   - if hives are still uncontrolled with the above regimen, please arrange an appointment for discussion of Xolair (omalizumab)- an injectable medication for hives      Follow up: 4 weeks   Thank you so much for letting me partake in your care today.  Don't hesitate to reach out if you have any additional concerns!  Andrea Marion, MD  [facility] [place] [place]   No orders of the defined types were placed in this encounter.  Lab Orders         CBC With Diff/Platelet         CMP14+EGFR         C-reactive protein         Alpha-Gal  Panel         Allergens w/Total IgE Area 2         Tryptase         Chronic Urticaria         TSH         Thyroid antibodies      Other allergy screening: Asthma: no Rhino conjunctivitis: yes mild nasal congestion, rhinnorhea in spring and fall, does not take any medical therapy. Denies animal triggers, she does have GERD. Denies any sinus surgery Food allergy: no Medication allergy: yes Hymenoptera allergy: no Urticaria:  yes Eczema:no History of recurrent infections suggestive of immunodeficency: no  Diagnostics: None done   Past Medical History: Patient Active Problem List   Diagnosis Date Noted   Bilateral carpal tunnel syndrome 12/04/2022   Diabetes type 2, controlled (HCC) 12/04/2022   Vitamin D deficiency 12/04/2022   Osteopenia 12/04/2022   OSA (obstructive sleep apnea) 12/04/2022   Left thyroid nodule 12/04/2022   Personal history of kidney stones 12/04/2022   Allergy 12/04/2022   Hyperparathyroidism (HCC) 12/04/2022   Leukocytosis 09/01/2015   Acute renal failure (ARF) (HCC) 09/01/2015   Trigeminal neuralgia 09/01/2015   Hyperlipidemia 09/01/2015   Ureterolithiasis 08/31/2015   Hydronephrosis of right kidney 08/31/2015   Hypokalemia 08/31/2015   Hypertension 07/11/2013   Anemia 07/11/2013   Urticaria 07/11/2013   Past Medical History:  Diagnosis Date   Acute renal failure (ARF) (HCC)    Allergy    Anemia    Bilateral carpal tunnel syndrome    Colon polyp    Diabetes type 2, controlled (HCC) 12/04/2022   GERD (gastroesophageal reflux disease)    Hydronephrosis of right kidney    Hyperlipidemia    Hyperparathyroidism (HCC)    Hypertension    Kidney stone    Left thyroid nodule    OSA (obstructive sleep apnea)    Osteoarthritis    Osteopenia    Sepsis due to Gram negative bacteria (HCC) 09/01/2015   Trigeminal neuralgia    Urticaria    Vitamin D deficiency    Past Surgical History: Past Surgical History:  Procedure Laterality Date   APPENDECTOMY  2020   CESAREAN SECTION  1980 1989   FACIAL NERVE SURGERY  11/18/1998   Gammaknife   PARATHYROIDECTOMY     2022   right total hip Right 2019   Medication List:  Current Outpatient Medications  Medication Sig Dispense Refill   aspirin EC 81 MG tablet Take 81 mg by mouth daily.     Cholecalciferol (VITAMIN D3) 25 MCG (1000 UT) CAPS Take 1 capsule (1,000 Units total) by mouth daily. 60 capsule    gabapentin (NEURONTIN)  300 MG capsule Take 1 capsule (300 mg total) by mouth 3 (three) times daily. 270 capsule 1   Iron Polysacch Cmplx-B12-FA (POLY-IRON 150 FORTE) 150-0.025-1 MG CAPS Take 1 Capful by mouth daily.  0   magnesium oxide (MAG-OX) 400 MG tablet Take 400 mg by mouth daily.     pantoprazole (PROTONIX) 40 MG tablet Take 1 tablet (40 mg total) by mouth 2 (two) times daily. 180 tablet 1   potassium chloride (KLOR-CON M10) 10 MEQ tablet Take 1 tablet (10 mEq total) by mouth 2 (two) times daily. 180 tablet 1   pravastatin (PRAVACHOL) 40 MG tablet Take 1 tablet (40 mg total) by mouth daily. 90 tablet 1   SYNJARDY XR 25-1000 MG TB24 Take 1 tablet by mouth daily. 30 tablet 5   valsartan-hydrochlorothiazide (DIOVAN-HCT) 160-25 MG tablet Take  1 tablet by mouth every morning. 90 tablet 1   No current facility-administered medications for this visit.   Allergies: Allergies  Allergen Reactions   Carbamazepine Hives   Dilantin [Phenytoin Sodium Extended] Hives and Swelling   Social History: Social History   Socioeconomic History   Marital status: Married    Spouse name: Andrea Melton    Number of children: 3   Years of education: 16   Highest education level: Not on file  Occupational History   Occupation: Engineer, site: OTHER    Comment: [employer]   Tobacco Use   Smoking status: Former    Packs/day: 0.25    Years: 5.00    Total pack years: 1.25    Types: Cigarettes    Quit date: 11/18/1968    Years since quitting: 54.1   Smokeless tobacco: Never  Substance and Sexual Activity   Alcohol use: Yes    Alcohol/week: 2.0 standard drinks of alcohol    Types: 2 Glasses of wine per week    Comment: Twice Weekly    Drug use: No   Sexual activity: Yes    Partners: Male  Other Topics Concern   Not on file  Social History Narrative   Marital Status:  Married Andrea Melton)    Children:  Daughters (3)    Pets: Cats (3)    Living Situation: Lives with  husband and 2 daughters   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    Alcohol Use:  Twice weekly (2 glasses of wine)    Drug Use:  None   Diet:  RegularExercise:  Cardio and weights three times weekly   Hobbies:  Sewing, Reading and gardening       Social Determinants of Health   Financial Resource Strain: Not on file  Food Insecurity: Not on file  Transportation Needs: Not on file  Physical Activity: Not on file  Stress: Not on file  Social Connections: Not on file   Lives in a single-family home that is 69 years old.  There are no roaches in the house advised to get on the floor.  There are no dust mite precautions on better pillows.  Not exposed to fumes, chemicals or dust.  No HEPA filter in the home and was not near an interstate industrial area. Smoking: No exposure Occupation: Health visitor History: Environmental education officer in the house: no Charity fundraiser in the family room: yes Carpet in the bedroom: yes Heating: gas Cooling: central Pet: yes cats with access to bedroom  Family History: Family History  Problem Relation Age of Onset   Hypertension Mother    Stroke Mother    Heart disease Father    Hypertension Father    Stroke Father    Depression Maternal Grandmother    Hypertension Brother    Kidney disease Brother        Kidney Stones    Cancer Sister        Breast/Uterine    Hypertension Sister    Kidney disease Sister        Kidney Stone    Cancer Sister        Breast    Hypertension Sister      ROS: All others negative except as noted per HPI.   Objective: BP 132/86  Pulse 81   Temp 98 F (36.7 C) (Temporal)   Resp 18   Ht 5' 4.5" (1.638 m)   Wt 200 lb (90.7 kg)   SpO2 98%   BMI 33.80 kg/m  Body mass index is 33.8 kg/m.  General Appearance:  Alert, cooperative, no distress, appears stated age   Head:  Normocephalic, without obvious abnormality, atraumatic  Eyes:  Conjunctiva clear, EOM's intact  Nose: Nares normal,   Throat: Lips, tongue normal; teeth and gums normal,   Neck: Supple, symmetrical  Lungs:   , Respirations unlabored, no coughing  Heart:  , Appears well perfused  Extremities: No edema  Skin: Skin color, texture, turgor normal,  erythematous raised macules  on legs, beck and hands   Neurologic: No gross deficits   The plan was reviewed with the patient/family, and all questions/concerned were addressed.  It was my pleasure to see Havyn today and participate in her care. Please feel free to contact me with any questions or concerns.  Sincerely,  Andrea Marion, MD Allergy & Immunology  [facility] office: [phone] [facility] office: [phone]

## 2022-12-26 MED ORDER — POLY-IRON 150 FORTE 150-25-1 MG-MCG-MG PO CAPS: 1.0000 | ORAL_CAPSULE | Freq: Every day | ORAL | 1 refills | Status: DC

## 2023-01-06 ENCOUNTER — Other Ambulatory Visit (HOSPITAL_BASED_OUTPATIENT_CLINIC_OR_DEPARTMENT_OTHER): Payer: Self-pay

## 2023-01-08 ENCOUNTER — Encounter: Payer: Self-pay | Admitting: Family

## 2023-01-08 ENCOUNTER — Encounter: Payer: Self-pay | Admitting: Internal Medicine

## 2023-01-08 LAB — CBC WITH DIFF/PLATELET
Basophils Absolute: 0.1 10*3/uL (ref 0.0–0.2)
Basos: 1 %
EOS (ABSOLUTE): 0.2 10*3/uL (ref 0.0–0.4)
Eos: 3 %
Hematocrit: 41.7 % (ref 34.0–46.6)
Hemoglobin: 13.2 g/dL (ref 11.1–15.9)
Immature Grans (Abs): 0 10*3/uL (ref 0.0–0.1)
Immature Granulocytes: 0 %
Lymphocytes Absolute: 1.6 10*3/uL (ref 0.7–3.1)
Lymphs: 28 %
MCH: 26.4 pg — ABNORMAL LOW (ref 26.6–33.0)
MCHC: 31.7 g/dL (ref 31.5–35.7)
MCV: 83 fL (ref 79–97)
Monocytes Absolute: 0.4 10*3/uL (ref 0.1–0.9)
Monocytes: 8 %
Neutrophils Absolute: 3.4 10*3/uL (ref 1.4–7.0)
Neutrophils: 60 %
Platelets: 295 10*3/uL (ref 150–450)
RBC: 5 x10E6/uL (ref 3.77–5.28)
RDW: 14.2 % (ref 11.7–15.4)
WBC: 5.6 10*3/uL (ref 3.4–10.8)

## 2023-01-08 LAB — ALLERGENS W/TOTAL IGE AREA 2

## 2023-01-08 LAB — THYROID ANTIBODIES
Thyroglobulin Antibody: <1 IU/mL (ref 0.0–0.9)
Thyroperoxidase Ab SerPl-aCnc: 258 IU/mL — ABNORMAL HIGH (ref 0–34)

## 2023-01-08 LAB — CMP14+EGFR
ALT: 20 IU/L (ref 0–32)
AST: 18 IU/L (ref 0–40)
Albumin/Globulin Ratio: 1.8 (ref 1.2–2.2)
Albumin: 4.5 g/dL (ref 3.9–4.9)
Alkaline Phosphatase: 76 IU/L (ref 44–121)
BUN/Creatinine Ratio: 14 (ref 12–28)
BUN: 14 mg/dL (ref 8–27)
Bilirubin Total: 0.5 mg/dL (ref 0.0–1.2)
CO2: 23 mmol/L (ref 20–29)
Calcium: 9.5 mg/dL (ref 8.7–10.3)
Chloride: 100 mmol/L (ref 96–106)
Creatinine, Ser: 0.98 mg/dL (ref 0.57–1.00)
Globulin, Total: 2.5 g/dL (ref 1.5–4.5)
Glucose: 109 mg/dL — ABNORMAL HIGH (ref 70–99)
Potassium: 3.8 mmol/L (ref 3.5–5.2)
Sodium: 140 mmol/L (ref 134–144)
Total Protein: 7 g/dL (ref 6.0–8.5)
eGFR: 63 mL/min/{1.73_m2} (ref >59)

## 2023-01-08 LAB — ALPHA-GAL PANEL
Allergen Lamb IgE: <0.1 kU/L
Beef IgE: <0.1 kU/L
IgE (Immunoglobulin E), Serum: 16 IU/mL (ref 6–495)
O215-IgE Alpha-Gal: <0.1 kU/L
Pork IgE: <0.1 kU/L

## 2023-01-08 LAB — TRYPTASE: Tryptase: 3 ug/L (ref 2.2–13.2)

## 2023-01-08 LAB — CHRONIC URTICARIA: cu index: 2.9 (ref <10)

## 2023-01-08 LAB — TSH: TSH: 1.98 u[IU]/mL (ref 0.450–4.500)

## 2023-01-08 LAB — C-REACTIVE PROTEIN: CRP: 4 mg/L (ref 0–10)

## 2023-01-08 MED ORDER — POLY-IRON 150 FORTE 150-25-1 MG-MCG-MG PO CAPS: 1.0000 | ORAL_CAPSULE | Freq: Every day | ORAL | 1 refills | Status: DC

## 2023-01-08 NOTE — Progress Notes (Signed)
I reviewed the bloodwork. Blood count, kidney function, liver function, electrolytes, thyroid, autoimmune screener, inflammation markers, chronic urticaria index (checks for autoantibodies that trigger mast cells), tryptase (checks for mast cell issues) and alpha gal (checks for red meat allergy) were all normal which is great.    Your anti- TPO (thyroid antibody) was positive.  This can be a marker of more persistent hives.  Your thyroid function was normal so all we need to do is repeat thyroid function annually and continue to monitor and treat your hives.  Can someone let patient know?

## 2023-01-09 NOTE — Telephone Encounter (Signed)
If you can control your hives with managing your Allegra I think it is fine to not have a follow-up.  But if hives recur and are not controlled with 2-4 tabs of Allegra daily please come back as we do have some other options.  I am so glad to hear you are no longer having problems!

## 2023-01-22 ENCOUNTER — Ambulatory Visit: Payer: BC Managed Care – PPO | Admitting: Internal Medicine

## 2023-02-18 ENCOUNTER — Other Ambulatory Visit (HOSPITAL_BASED_OUTPATIENT_CLINIC_OR_DEPARTMENT_OTHER): Payer: Self-pay

## 2023-03-05 ENCOUNTER — Ambulatory Visit (INDEPENDENT_AMBULATORY_CARE_PROVIDER_SITE_OTHER): Payer: BC Managed Care – PPO | Admitting: Family

## 2023-03-05 ENCOUNTER — Telehealth: Payer: Self-pay | Admitting: Family

## 2023-03-05 ENCOUNTER — Encounter: Payer: Self-pay | Admitting: Family

## 2023-03-05 VITALS — BP 126/84 | HR 77 | Temp 98.5°F | Resp 16 | Ht 64.5 in | Wt 199.0 lb

## 2023-03-05 DIAGNOSIS — E559 Vitamin D deficiency, unspecified: Secondary | ICD-10-CM

## 2023-03-05 DIAGNOSIS — L509 Urticaria, unspecified: Secondary | ICD-10-CM

## 2023-03-05 DIAGNOSIS — Z23 Encounter for immunization: Secondary | ICD-10-CM

## 2023-03-05 DIAGNOSIS — Z0000 Encounter for general adult medical examination without abnormal findings: Secondary | ICD-10-CM | POA: Diagnosis not present

## 2023-03-05 DIAGNOSIS — G4733 Obstructive sleep apnea (adult) (pediatric): Secondary | ICD-10-CM | POA: Diagnosis not present

## 2023-03-05 LAB — VITAMIN D 25 HYDROXY (VIT D DEFICIENCY, FRACTURES): VITD: 46.1 ng/mL (ref 30.00–100.00)

## 2023-03-05 NOTE — Assessment & Plan Note (Signed)
She completed a 12 week course of vitamin D. Will repeat vit D today.

## 2023-03-05 NOTE — Telephone Encounter (Signed)
Electronic request sent 

## 2023-03-05 NOTE — Assessment & Plan Note (Signed)
Resolved with allegra.  Allergist did not pursue further work up.

## 2023-03-05 NOTE — Assessment & Plan Note (Signed)
Discussed healthy diet, exercise and weight loss.  Colo up to date- will request report.  Mammo up to date, dexa UTD.  Prevnar 20 today and Shingrix #1.

## 2023-03-05 NOTE — Assessment & Plan Note (Signed)
Did not tolerate CPAP. We discussed referring her back to a sleep specialist to discuss oral appliance but she declines at this time.

## 2023-03-05 NOTE — Progress Notes (Signed)
Subjective:   By signing my name below, I, Barrett Shell, attest that this documentation has been prepared under the direction and in the presence of Sandford Craze, NP. 03/05/2023   Patient ID: Andrea Melton, female    DOB: 1956-05-23, 67 y.o.   MRN: [id number]  Chief Complaint  Patient presents with   Annual Exam    HPI Patient is in today for a comprehensive physical exam.   Allergies: She had an appointment with the allergist recently. She takes allegra when she has hive flare-ups and finds relief with it.   Sleep Apnea: She does not find relief from a CPAP. She has not done a sleep study for the last several years, but is not interested in following up at his time.   Parathyroid: She had a parathyroid nodule (adenoma) removed on 02/16/2021. She had a follow-up appointment after the procedure, but has not had any more thyroid appointments since then. She tells me that she never had a thyroid nodule removed, just parathyroid.   Constipation: She complains of constipation. She reports eating plenty of fiber in her diet.  Right Knee: She complains of joint pain in her news that radiate downward towards her feet.   Flatulence: She complains of flatulence. Low fodmap diet explained to her during this visit.   GOLO: She inquired about the GOLO supplement during this visit for weight loss.    Acute: She denies fever, unexpected weight change, adenopathy, new moles, sinus pain, sore throat, visual disturbance, chest pain, palpitations, leg swelling, cough, shortness of breath, wheezing, nausea, vomiting, diarrhea, blood in stool, dysuria, frequency, hematuria, new muscle pain, headaches, depression or anxiety at this time.   Colonoscopy: Last colonoscopy completed in 2022.   Dexa: Last Dexa completed in 2022.   Mammogram: Last mammogram completed on 09/17/2022. Results are normal   Social history: No changes in family history.   Immunizations: She is interested in  receiving the Shingrix immunization and the pneumonia immunization during this visit.  Exercise: She does not exercise regularly.   Dental: She is UTD on dental.   Vision:  She is UTD on vision.  Past Medical History:  Diagnosis Date   Acute renal failure (ARF)    Allergy    Anemia    Bilateral carpal tunnel syndrome    Colon polyp    Diabetes type 2, controlled 12/04/2022   GERD (gastroesophageal reflux disease)    Hydronephrosis of right kidney    Hyperlipidemia    Hyperparathyroidism    Hypertension    Kidney stone    OSA (obstructive sleep apnea)    Osteoarthritis    Osteopenia    Parathyroid adenoma    s/p excision   Sepsis due to Gram negative bacteria 09/01/2015   Trigeminal neuralgia    Urticaria    Vitamin D deficiency     Past Surgical History:  Procedure Laterality Date   APPENDECTOMY  2020   CESAREAN SECTION  1980 1989   FACIAL NERVE SURGERY  11/18/1998   Gammaknife   PARATHYROIDECTOMY     2022   right total hip Right 2019    Family History  Problem Relation Age of Onset   Hypertension Mother    Stroke Mother    Heart disease Father    Hypertension Father    Stroke Father    Depression Maternal Grandmother    Hypertension Brother    Kidney disease Brother        Kidney Stones    Cancer Sister  Breast/Uterine    Hypertension Sister    Kidney disease Sister        Kidney Stone    Cancer Sister        Breast    Hypertension Sister     Social History   Socioeconomic History   Marital status: Married    Spouse name: [name]    Number of children: 3   Years of education: 16   Highest education level: Not on file  Occupational History   Occupation: ASSISTANT DIRECTOR     Employer: OTHER    Comment: [employer]   Tobacco Use   Smoking status: Former    Packs/day: 0.25    Years: 5.00    Additional pack years: 0.00    Total pack years: 1.25    Types: Cigarettes    Quit date: 11/18/1968    Years since quitting: 54.3   Smokeless  tobacco: Never  Substance and Sexual Activity   Alcohol use: Yes    Alcohol/week: 2.0 standard drinks of alcohol    Types: 2 Glasses of wine per week    Comment: Twice Weekly    Drug use: No   Sexual activity: Yes    Partners: Male  Other Topics Concern   Not on file  Social History Narrative   Marital Status:  Married [name])    Children:  Daughters (3)    Pets: Cats (3)    Living Situation: Lives with  husband and 2 daughters   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    Alcohol Use:  Twice weekly (2 glasses of wine)    Drug Use:  None   Diet:  RegularExercise:  Cardio and weights three times weekly   Hobbies:  Sewing, Reading and gardening       Social Determinants of Health   Financial Resource Strain: Not on file  Food Insecurity: Not on file  Transportation Needs: Not on file  Physical Activity: Not on file  Stress: Not on file  Social Connections: Not on file  Intimate Partner Violence: Not on file    Outpatient Medications Prior to Visit  Medication Sig Dispense Refill   aspirin EC 81 MG tablet Take 81 mg by mouth daily.     Cholecalciferol (VITAMIN D3) 25 MCG (1000 UT) CAPS Take 1 capsule (1,000 Units total) by mouth daily. 60 capsule    gabapentin (NEURONTIN) 300 MG capsule Take 1 capsule (300 mg total) by mouth 3 (three) times daily. 270 capsule 1   Iron Polysacch Cmplx-B12-FA (POLY-IRON 150 FORTE) 150-0.025-1 MG CAPS Take 1 capsule by mouth daily. 90 capsule 1   magnesium oxide (MAG-OX) 400 MG tablet Take 400 mg by mouth daily.     pantoprazole (PROTONIX) 40 MG tablet Take 1 tablet (40 mg total) by mouth 2 (two) times daily. 180 tablet 1   potassium chloride (KLOR-CON M10) 10 MEQ tablet Take 1 tablet (10 mEq total) by mouth 2 (two) times daily. 180 tablet 1   pravastatin  (PRAVACHOL) 40 MG tablet Take 1 tablet (40 mg total) by mouth daily. 90 tablet 1   SYNJARDY XR 25-1000 MG TB24 Take 1 tablet by mouth daily. 30 tablet 5   valsartan-hydrochlorothiazide (DIOVAN-HCT) 160-25 MG tablet Take 1 tablet by mouth every morning. 90 tablet 1   No facility-administered medications prior to visit.  Allergies  Allergen Reactions   Carbamazepine Hives   Dilantin [Phenytoin Sodium Extended] Hives and Swelling    Review of Systems  Constitutional:  Negative for fever.       (-) unexpected weight changes   HENT:  Negative for sinus pain and sore throat.   Eyes:        (-) visual disturbances   Respiratory:  Negative for cough, shortness of breath and wheezing.   Cardiovascular:  Negative for chest pain, palpitations and leg swelling.  Gastrointestinal:  Positive for constipation. Negative for blood in stool, diarrhea, nausea and vomiting.  Genitourinary:  Negative for dysuria, frequency and hematuria.  Musculoskeletal:  Positive for joint pain (right knee).       (-) new muscle pain  Skin:        (-) new moles  Neurological:  Negative for headaches.  Psychiatric/Behavioral:  Negative for depression.        (-) anxiety       Objective:    Physical Exam Constitutional:      General: She is not in acute distress.    Appearance: Normal appearance.  HENT:     Head: Normocephalic and atraumatic.     Right Ear: Tympanic membrane, ear canal and external ear normal.     Left Ear: Tympanic membrane, ear canal and external ear normal.     Mouth/Throat:     Mouth: Mucous membranes are moist.     Pharynx: Oropharynx is clear.  Eyes:     Extraocular Movements: Extraocular movements intact.     Right eye: No nystagmus.     Left eye: No nystagmus.     Pupils: Pupils are equal, round, and reactive to light.  Cardiovascular:     Rate and Rhythm: Normal rate and regular rhythm.     Heart sounds: Normal heart sounds. No murmur heard.    No gallop.  Pulmonary:      Effort: Pulmonary effort is normal. No respiratory distress.     Breath sounds: Normal breath sounds. No wheezing or rales.  Abdominal:     General: Bowel sounds are normal. There is no distension.     Palpations: Abdomen is soft.     Tenderness: There is no abdominal tenderness. There is no guarding.  Musculoskeletal:     Cervical back: Normal range of motion.     Comments: (+) 5/5 strength in upper and lower extremities  Lymphadenopathy:     Cervical: No cervical adenopathy.  Skin:    General: Skin is warm.  Neurological:     Mental Status: She is alert and oriented to person, place, and time.     Deep Tendon Reflexes:     Reflex Scores:      Patellar reflexes are 2+ on the right side and 2+ on the left side. Psychiatric:        Judgment: Judgment normal.     BP 126/84 (BP Location: Right Arm, Patient Position: Sitting, Cuff Size: Small)   Pulse 77   Temp 98.5 F (36.9 C) (Oral)   Resp 16   Ht 5' 4.5" (1.638 m)   Wt 199 lb (90.3 kg)   SpO2 99%   BMI 33.63 kg/m  Wt Readings from Last 3 Encounters:  03/05/23 199 lb (90.3 kg)  12/25/22 200 lb (90.7 kg)  12/04/22 200 lb 9.6 oz (91 kg)       Assessment & Plan:  Vitamin D deficiency Assessment & Plan: She completed a 12 week course of  vitamin D. Will repeat vit D today.  Orders: -     VITAMIN D 25 Hydroxy (Vit-D Deficiency, Fractures)  Need for pneumococcal 20-valent conjugate vaccination -     Pneumococcal conjugate vaccine 20-valent  Need for shingles vaccine -     Varicella-zoster vaccine IM  Urticaria Assessment & Plan: Resolved with allegra.  Allergist did not pursue further work up.    OSA (obstructive sleep apnea) Assessment & Plan: Did not tolerate CPAP. We discussed referring her back to a sleep specialist to discuss oral appliance but she declines at this time.    Preventative health care Assessment & Plan: Discussed healthy diet, exercise and weight loss.  Colo up to date- will request report.   Mammo up to date, dexa UTD.  Prevnar 20 today and Shingrix #1.       I, Lemont Fillers, NP, personally preformed the services described in this documentation.  All medical record entries made by the scribe were at my direction and in my presence.  I have reviewed the chart and discharge instructions (if applicable) and agree that the record reflects my personal performance and is accurate and complete. 03/05/2023  Lemont Fillers, NP   Mercer Pod as a scribe for Lemont Fillers, NP.,have documented all relevant documentation on the behalf of Lemont Fillers, NP,as directed by  Lemont Fillers, NP while in the presence of Lemont Fillers, NP.

## 2023-03-05 NOTE — Telephone Encounter (Signed)
Please call Dr. Irwin Brakeman office to request copy of colonoscopy.

## 2023-03-10 ENCOUNTER — Other Ambulatory Visit (HOSPITAL_BASED_OUTPATIENT_CLINIC_OR_DEPARTMENT_OTHER): Payer: Self-pay

## 2023-03-11 ENCOUNTER — Encounter: Payer: Self-pay | Admitting: Family

## 2023-03-12 ENCOUNTER — Other Ambulatory Visit (HOSPITAL_BASED_OUTPATIENT_CLINIC_OR_DEPARTMENT_OTHER): Payer: Self-pay

## 2023-03-12 ENCOUNTER — Other Ambulatory Visit: Payer: Self-pay

## 2023-03-12 MED ORDER — POLY-IRON 150 FORTE 150-25-1 MG-MCG-MG PO CAPS
1.0000 | ORAL_CAPSULE | Freq: Every day | ORAL | 1 refills | Status: DC
  Filled 2023-03-12: qty 100, 100d supply, fill #0
  Filled 2023-06-06: qty 100, 100d supply, fill #1

## 2023-03-13 ENCOUNTER — Other Ambulatory Visit (HOSPITAL_COMMUNITY): Payer: Self-pay

## 2023-03-13 ENCOUNTER — Other Ambulatory Visit (HOSPITAL_BASED_OUTPATIENT_CLINIC_OR_DEPARTMENT_OTHER): Payer: Self-pay

## 2023-03-13 ENCOUNTER — Encounter: Payer: Self-pay | Admitting: Family

## 2023-03-14 ENCOUNTER — Other Ambulatory Visit: Payer: Self-pay

## 2023-03-14 ENCOUNTER — Telehealth: Payer: Self-pay | Admitting: Family

## 2023-03-14 ENCOUNTER — Other Ambulatory Visit (HOSPITAL_BASED_OUTPATIENT_CLINIC_OR_DEPARTMENT_OTHER): Payer: Self-pay

## 2023-03-14 DIAGNOSIS — I10 Essential (primary) hypertension: Secondary | ICD-10-CM

## 2023-03-14 DIAGNOSIS — E119 Type 2 diabetes mellitus without complications: Secondary | ICD-10-CM

## 2023-03-14 DIAGNOSIS — E785 Hyperlipidemia, unspecified: Secondary | ICD-10-CM

## 2023-03-14 DIAGNOSIS — G500 Trigeminal neuralgia: Secondary | ICD-10-CM

## 2023-03-14 MED ORDER — VALSARTAN-HYDROCHLOROTHIAZIDE 160-25 MG PO TABS
1.0000 | ORAL_TABLET | Freq: Every morning | ORAL | 0 refills | Status: DC
Start: 2023-03-14 — End: 2023-03-21

## 2023-03-14 MED ORDER — SYNJARDY XR 25-1000 MG PO TB24
1.0000 | ORAL_TABLET | Freq: Every day | ORAL | 0 refills | Status: DC
Start: 2023-03-14 — End: 2023-03-21

## 2023-03-14 MED ORDER — GABAPENTIN 300 MG PO CAPS
300.0000 mg | ORAL_CAPSULE | Freq: Three times a day (TID) | ORAL | 0 refills | Status: DC
Start: 2023-03-14 — End: 2023-06-06

## 2023-03-14 MED ORDER — PRAVASTATIN SODIUM 40 MG PO TABS
40.0000 mg | ORAL_TABLET | Freq: Every day | ORAL | 0 refills | Status: DC
Start: 2023-03-14 — End: 2023-03-21

## 2023-03-14 NOTE — Telephone Encounter (Signed)
Called pt and lvm to see exactly which meds she needed sent in.

## 2023-03-14 NOTE — Telephone Encounter (Signed)
Medications sent in by [place] and Letha message sent to pt.

## 2023-03-14 NOTE — Telephone Encounter (Signed)
Pt is out of town at a conference and forgot her medications and would like to see if we can send in a 5 day refill to [facility] in [address] [phone]. Address is in a mychart message sent to provider yesterday.    SYNJARDY XR 25-1000 MG TB24   pravastatin (PRAVACHOL) 40 MG tablet   valsartan-hydrochlorothiazide (DIOVAN-HCT) 160-25 MG tablet   gabapentin (NEURONTIN) 300 MG capsule   Please send message to her via mychart advising if this is possible.

## 2023-03-14 NOTE — Telephone Encounter (Signed)
Initial Comment Caller states she is out of town in [place] and left her medication at home. She would like refills on her medications. Synjardy XR 25-1000mg , Valsartan 160-25mg , Pravastatin 40mg , Gabapentin 300mg  Translation No Nurse Assessment Nurse: Shanna Cisco, RN, Gavin Pound Date/Time (Eastern Time): 03/14/2023 8:55:17 AM Please select the assessment type ---Refill Additional Documentation ---Caller states she is out of town in [place] and left her medications at home. She would like refills on her medications. Synjardy XR 25-1000mg , Valsartan 160-25mg , Pravastatin 40mg , Gabapentin 300mg . Denies new or worsening symptoms. Does the patient have enough medication to last until the office opens? ---No Additional Documentation ---Will contact office, it is open. Disp. Time Lamount Cohen Time) Disposition Final User 03/14/2023 8:58:04 AM Pharmacy Call Shanna Cisco, RN, Deborah Reason: Rx Refills. 03/14/2023 8:58:54 AM Clinical Call Yes Shanna Cisco, RN, Deborah Final Disposition 03/14/2023 8:58:54 AM Clinical Call Yes Shanna Cisco, RN, Deborah Comments User: Karilyn Cota, RN Date/Time Lamount Cohen Time): 03/14/2023 9:00:41 AM PLEASE NOTE: All timestamps contained within this report are represented as [place] Standard Time. CONFIDENTIALTY NOTICE: This fax transmission is intended only for the addressee. It contains information that is legally privileged, confidential or otherwise protected from use or disclosure. If you are not the intended recipient, you are strictly prohibited from reviewing, disclosing, copying using or disseminating any of this information or taking any action in reliance on or regarding this information. If you have received this fax in error, please notify us immediately by telephone so that we can arrange for its return to [place]. Phone: [phone], Toll-Free: [phone], Fax: [phone] Page: 2 of 2 Call Id: [id number] Comments Call  was warm transferred to the office

## 2023-03-21 ENCOUNTER — Other Ambulatory Visit (HOSPITAL_BASED_OUTPATIENT_CLINIC_OR_DEPARTMENT_OTHER): Payer: Self-pay

## 2023-03-21 ENCOUNTER — Other Ambulatory Visit: Payer: Self-pay | Admitting: Family

## 2023-03-21 DIAGNOSIS — E119 Type 2 diabetes mellitus without complications: Secondary | ICD-10-CM

## 2023-03-21 DIAGNOSIS — G500 Trigeminal neuralgia: Secondary | ICD-10-CM

## 2023-03-21 DIAGNOSIS — E785 Hyperlipidemia, unspecified: Secondary | ICD-10-CM

## 2023-03-21 DIAGNOSIS — I10 Essential (primary) hypertension: Secondary | ICD-10-CM

## 2023-03-21 DIAGNOSIS — K219 Gastro-esophageal reflux disease without esophagitis: Secondary | ICD-10-CM

## 2023-03-21 DIAGNOSIS — E876 Hypokalemia: Secondary | ICD-10-CM

## 2023-03-21 MED ORDER — PANTOPRAZOLE SODIUM 40 MG PO TBEC
40.0000 mg | DELAYED_RELEASE_TABLET | Freq: Two times a day (BID) | ORAL | 1 refills | Status: DC
Start: 2023-03-21 — End: 2023-09-05
  Filled 2023-03-21 – 2023-06-06 (×2): qty 180, 90d supply, fill #0

## 2023-03-21 MED ORDER — PRAVASTATIN SODIUM 40 MG PO TABS
40.0000 mg | ORAL_TABLET | Freq: Every day | ORAL | 1 refills | Status: DC
Start: 2023-03-21 — End: 2023-09-05
  Filled 2023-03-21 – 2023-06-06 (×2): qty 90, 90d supply, fill #0

## 2023-03-21 MED ORDER — POTASSIUM CHLORIDE CRYS ER 10 MEQ PO TBCR
10.0000 meq | EXTENDED_RELEASE_TABLET | Freq: Two times a day (BID) | ORAL | 1 refills | Status: DC
Start: 2023-03-21 — End: 2023-09-05
  Filled 2023-03-21 – 2023-06-06 (×2): qty 180, 90d supply, fill #0

## 2023-03-21 MED ORDER — SYNJARDY XR 25-1000 MG PO TB24
1.0000 | ORAL_TABLET | Freq: Every day | ORAL | 1 refills | Status: DC
Start: 2023-03-21 — End: 2023-09-05
  Filled 2023-03-21: qty 90, 90d supply, fill #0
  Filled 2023-06-06: qty 90, 90d supply, fill #1

## 2023-03-21 MED ORDER — VALSARTAN-HYDROCHLOROTHIAZIDE 160-25 MG PO TABS
1.0000 | ORAL_TABLET | Freq: Every morning | ORAL | 1 refills | Status: DC
Start: 2023-03-21 — End: 2023-09-05
  Filled 2023-03-21 – 2023-06-06 (×2): qty 90, 90d supply, fill #0

## 2023-03-21 NOTE — Progress Notes (Signed)
[facility] requesting refills to them per patient.

## 2023-03-24 ENCOUNTER — Other Ambulatory Visit (HOSPITAL_BASED_OUTPATIENT_CLINIC_OR_DEPARTMENT_OTHER): Payer: Self-pay

## 2023-06-06 ENCOUNTER — Other Ambulatory Visit: Payer: Self-pay

## 2023-06-06 ENCOUNTER — Other Ambulatory Visit (HOSPITAL_BASED_OUTPATIENT_CLINIC_OR_DEPARTMENT_OTHER): Payer: Self-pay

## 2023-06-06 ENCOUNTER — Other Ambulatory Visit: Payer: Self-pay | Admitting: Family

## 2023-06-06 DIAGNOSIS — G500 Trigeminal neuralgia: Secondary | ICD-10-CM

## 2023-06-06 MED ORDER — GABAPENTIN 300 MG PO CAPS
300.0000 mg | ORAL_CAPSULE | Freq: Three times a day (TID) | ORAL | 0 refills | Status: DC
Start: 2023-06-06 — End: 2023-07-01
  Filled 2023-06-06: qty 15, 5d supply, fill #0

## 2023-06-06 MED ORDER — MAGNESIUM OXIDE 400 MG PO TABS
400.0000 mg | ORAL_TABLET | Freq: Every day | ORAL | 0 refills | Status: DC
  Filled 2023-06-06: qty 120, 120d supply, fill #0

## 2023-06-09 ENCOUNTER — Other Ambulatory Visit (HOSPITAL_BASED_OUTPATIENT_CLINIC_OR_DEPARTMENT_OTHER): Payer: Self-pay

## 2023-07-01 ENCOUNTER — Other Ambulatory Visit: Payer: Self-pay | Admitting: Family

## 2023-07-01 ENCOUNTER — Other Ambulatory Visit (HOSPITAL_BASED_OUTPATIENT_CLINIC_OR_DEPARTMENT_OTHER): Payer: Self-pay

## 2023-07-01 DIAGNOSIS — G500 Trigeminal neuralgia: Secondary | ICD-10-CM

## 2023-07-01 MED ORDER — GABAPENTIN 300 MG PO CAPS
300.0000 mg | ORAL_CAPSULE | Freq: Three times a day (TID) | ORAL | 1 refills | Status: DC
Start: 2023-07-01 — End: 2023-09-05
  Filled 2023-07-01: qty 270, 90d supply, fill #0

## 2023-07-02 ENCOUNTER — Other Ambulatory Visit (HOSPITAL_BASED_OUTPATIENT_CLINIC_OR_DEPARTMENT_OTHER): Payer: Self-pay

## 2023-09-05 ENCOUNTER — Other Ambulatory Visit (HOSPITAL_BASED_OUTPATIENT_CLINIC_OR_DEPARTMENT_OTHER): Payer: Self-pay

## 2023-09-05 ENCOUNTER — Ambulatory Visit: Payer: BC Managed Care – PPO | Admitting: Family

## 2023-09-05 ENCOUNTER — Other Ambulatory Visit: Payer: Self-pay

## 2023-09-05 VITALS — BP 132/72 | HR 78 | Resp 18 | Ht 64.5 in | Wt 197.8 lb

## 2023-09-05 DIAGNOSIS — E785 Hyperlipidemia, unspecified: Secondary | ICD-10-CM | POA: Diagnosis not present

## 2023-09-05 DIAGNOSIS — G500 Trigeminal neuralgia: Secondary | ICD-10-CM | POA: Diagnosis not present

## 2023-09-05 DIAGNOSIS — Z23 Encounter for immunization: Secondary | ICD-10-CM | POA: Diagnosis not present

## 2023-09-05 DIAGNOSIS — I10 Essential (primary) hypertension: Secondary | ICD-10-CM

## 2023-09-05 DIAGNOSIS — E876 Hypokalemia: Secondary | ICD-10-CM

## 2023-09-05 DIAGNOSIS — E213 Hyperparathyroidism, unspecified: Secondary | ICD-10-CM

## 2023-09-05 DIAGNOSIS — Z7985 Long-term (current) use of injectable non-insulin antidiabetic drugs: Secondary | ICD-10-CM

## 2023-09-05 DIAGNOSIS — K2270 Barrett's esophagus without dysplasia: Secondary | ICD-10-CM

## 2023-09-05 DIAGNOSIS — E119 Type 2 diabetes mellitus without complications: Secondary | ICD-10-CM | POA: Diagnosis not present

## 2023-09-05 LAB — LIPID PANEL
Cholesterol: 184 mg/dL (ref 0–200)
HDL: 78.5 mg/dL (ref >39.00)
LDL Cholesterol: 90 mg/dL (ref 0–99)
NonHDL: 105.54
Total CHOL/HDL Ratio: 2
Triglycerides: 80 mg/dL (ref 0.0–149.0)
VLDL: 16 mg/dL (ref 0.0–40.0)

## 2023-09-05 LAB — COMPREHENSIVE METABOLIC PANEL
ALT: 23 U/L (ref 0–35)
AST: 19 U/L (ref 0–37)
Albumin: 4.3 g/dL (ref 3.5–5.2)
Alkaline Phosphatase: 71 U/L (ref 39–117)
BUN: 17 mg/dL (ref 6–23)
CO2: 28 meq/L (ref 19–32)
Calcium: 10 mg/dL (ref 8.4–10.5)
Chloride: 101 meq/L (ref 96–112)
Creatinine, Ser: 0.84 mg/dL (ref 0.40–1.20)
GFR: 70.82 mL/min (ref >60.00)
Glucose, Bld: 111 mg/dL — ABNORMAL HIGH (ref 70–99)
Potassium: 4 meq/L (ref 3.5–5.1)
Sodium: 140 meq/L (ref 135–145)
Total Bilirubin: 0.7 mg/dL (ref 0.2–1.2)
Total Protein: 7.1 g/dL (ref 6.0–8.3)

## 2023-09-05 LAB — HEMOGLOBIN A1C: Hgb A1c MFr Bld: 6.4 % (ref 4.6–6.5)

## 2023-09-05 MED ORDER — SYNJARDY XR 25-1000 MG PO TB24
1.0000 | ORAL_TABLET | Freq: Every day | ORAL | 1 refills | Status: DC
  Filled 2023-09-05: qty 90, 90d supply, fill #0
  Filled 2023-12-17: qty 90, 90d supply, fill #1

## 2023-09-05 MED ORDER — GABAPENTIN 300 MG PO CAPS
600.0000 mg | ORAL_CAPSULE | Freq: Three times a day (TID) | ORAL | 1 refills | Status: DC
  Filled 2023-09-05 (×2): qty 270, 45d supply, fill #0

## 2023-09-05 MED ORDER — GABAPENTIN 300 MG PO CAPS
600.0000 mg | ORAL_CAPSULE | Freq: Three times a day (TID) | ORAL | 1 refills | Status: DC
  Filled 2023-09-05 – 2023-09-08 (×2): qty 360, 60d supply, fill #0

## 2023-09-05 MED ORDER — POTASSIUM CHLORIDE CRYS ER 10 MEQ PO TBCR
10.0000 meq | EXTENDED_RELEASE_TABLET | Freq: Two times a day (BID) | ORAL | 1 refills | Status: DC
  Filled 2023-09-05: qty 180, 90d supply, fill #0
  Filled 2023-12-03: qty 180, 90d supply, fill #1

## 2023-09-05 MED ORDER — PRAVASTATIN SODIUM 40 MG PO TABS
40.0000 mg | ORAL_TABLET | Freq: Every day | ORAL | 1 refills | Status: DC
  Filled 2023-09-05: qty 90, 90d supply, fill #0
  Filled 2023-12-03: qty 90, 90d supply, fill #1

## 2023-09-05 MED ORDER — VALSARTAN-HYDROCHLOROTHIAZIDE 160-25 MG PO TABS
1.0000 | ORAL_TABLET | Freq: Every morning | ORAL | 1 refills | Status: DC
  Filled 2023-09-05: qty 90, 90d supply, fill #0
  Filled 2023-12-03: qty 90, 90d supply, fill #1

## 2023-09-05 NOTE — Assessment & Plan Note (Signed)
Improved with higher dose of gabapentin.

## 2023-09-05 NOTE — Assessment & Plan Note (Signed)
Continues potassium 10 mEQ bid as well as magnesium daily.

## 2023-09-05 NOTE — Assessment & Plan Note (Signed)
She is s/p parathyroidectomy.

## 2023-09-05 NOTE — Assessment & Plan Note (Signed)
Lab Results  Component Value Date   HGBA1C 6.6 (H) 12/04/2022   HGBA1C 6.5 (H) 08/31/2015   Lab Results  Component Value Date   MICROALBUR <0.7 12/04/2022   LDLCALC 67 09/01/2015   CREATININE 0.98 12/25/2022  Stable on synjardy, continue same, update A1C.

## 2023-09-05 NOTE — Progress Notes (Signed)
Subjective:     Patient ID: Andrea Melton, female    DOB: 03-06-1956, 68 y.o.   MRN: [id number]  Chief Complaint  Patient presents with   Follow-up    HPI  Discussed the use of AI scribe software for clinical note transcription with the patient, who gave verbal consent to proceed.  History of Present Illness    The patient, with a history of hypertension, diabetes, hyperlipidemia, GERD, constipation, and trigeminal neuralgia, presents for a routine follow-up. She reports an increase in gabapentin dosage to 600mg , taken twice daily and a third time as needed, which was prescribed by her neurologist for trigeminal neuralgia. The patient notes that the increased dosage seems to be helping with the pain and she is not experiencing the "rolling pains" that she previously had.  The patient's blood pressure is well-controlled on valsartan hydrochlorothiazide. She does not regularly check her blood sugars at home, but her last A1c in January was 6. She is currently taking Synjardy for diabetes management.  The patient has been experiencing GERD symptoms and recently had an EGD/Colo.  EGD noted:   C0M2 Barrett's esophagus  Grade B esophagitis  5 cm hiatal hernia  Polyp in the stomach  The duodenal bulb and 2nd part of the duodenum appeared normal.   Colo noted:   Medium hemorrhoids  Healthy end-to-end ileocolonic anastomosis   GI changed her PPI from pantoprazole to omeprazole. The patient also reports issues with bowel movements, describing them as either very hard and difficult to pass or very sticky and difficult to pass. She has tried Miralax and Metamucil in the past without much success.  The patient is also taking potassium twice a day and magnesium once a day. She has received the first dose of the shingles vaccine and is due for the second dose.   Health Maintenance Due  Topic Date Due   FOOT EXAM  Never done   MAMMOGRAM  Never done   DEXA SCAN  Never done    COVID-19 Vaccine (3 - Moderna risk series) 03/13/2020   HEMOGLOBIN A1C  06/04/2023    Past Medical History:  Diagnosis Date   Acute renal failure (ARF) (HCC)    Allergy    Anemia    Bilateral carpal tunnel syndrome    Colon polyp    Diabetes type 2, controlled (HCC) 12/04/2022   GERD (gastroesophageal reflux disease)    Hydronephrosis of right kidney    Hyperlipidemia    Hyperparathyroidism (HCC)    Hypertension    Kidney stone    OSA (obstructive sleep apnea)    Osteoarthritis    Osteopenia    Parathyroid adenoma    s/p excision   Sepsis due to Gram negative bacteria (HCC) 09/01/2015   Trigeminal neuralgia    Urticaria    Vitamin D deficiency     Past Surgical History:  Procedure Laterality Date   APPENDECTOMY  2020   CESAREAN SECTION  1980 1989   FACIAL NERVE SURGERY  11/18/1998   Gammaknife   PARATHYROIDECTOMY     2022   right total hip Right 2019    Family History  Problem Relation Age of Onset   Hypertension Mother    Stroke Mother    Heart disease Father    Hypertension Father    Stroke Father    Depression Maternal Grandmother    Hypertension Brother    Kidney disease Brother        Kidney Stones    Cancer Sister  Breast/Uterine    Hypertension Sister    Kidney disease Sister        Kidney Stone    Cancer Sister        Breast    Hypertension Sister     Social History   Socioeconomic History   Marital status: Married    Spouse name: [name]    Number of children: 3   Years of education: 16   Highest education level: Not on file  Occupational History   Occupation: ASSISTANT DIRECTOR     Employer: OTHER    Comment: [employer]   Tobacco Use   Smoking status: Former    Current packs/day: 0.00    Average packs/day: 0.3 packs/day for 5.0 years (1.3 ttl pk-yrs)    Types: Cigarettes    Start date: 11/19/1963    Quit date: 11/18/1968    Years since quitting: 54.8   Smokeless tobacco: Never  Substance and Sexual Activity   Alcohol use: Yes     Alcohol/week: 2.0 standard drinks of alcohol    Types: 2 Glasses of wine per week    Comment: Twice Weekly    Drug use: No   Sexual activity: Yes    Partners: Male  Other Topics Concern   Not on file  Social History Narrative   Marital Status:  Married [name])    Children:  Daughters (3)    Pets: Cats (3)    Living Situation: Lives with  husband and 2 daughters   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    Alcohol Use:  Twice weekly (2 glasses of wine)    Drug Use:  None   Diet:  RegularExercise:  Cardio and weights three times weekly   Hobbies:  Sewing, Reading and gardening       Social Determinants of Health   Financial Resource Strain: Not on file  Food Insecurity: Not on file  Transportation Needs: Not on file  Physical Activity: Not on file  Stress: Not on file  Social Connections: Not on file  Intimate Partner Violence: Not on file    Outpatient Medications Prior to Visit  Medication Sig Dispense Refill   omeprazole (PRILOSEC) 40 MG capsule Take 40 mg by mouth 2 (two) times daily.     aspirin EC 81 MG tablet Take 81 mg by mouth daily.     Cholecalciferol (VITAMIN D3) 25 MCG (1000 UT) CAPS Take 1 capsule (1,000 Units total) by mouth daily. 60 capsule    Iron Polysacch Cmplx-B12-FA (POLY-IRON 150 FORTE) 150-0.025-1 MG CAPS Take 1 capsule by mouth daily. 100 capsule 1   magnesium oxide (MAG-OX) 400 MG tablet Take 1 tablet (400 mg total) by mouth daily. 120 tablet 0   gabapentin (NEURONTIN) 300 MG capsule Take 1 capsule (300 mg total) by mouth 3 (three) times daily. 270 capsule 1   pantoprazole (PROTONIX) 40 MG tablet Take 1 tablet (40 mg total) by mouth 2 (two) times daily. 180 tablet 1   potassium chloride (KLOR-CON M10) 10 MEQ tablet Take 1 tablet (10 mEq total) by mouth 2 (two) times  daily. 180 tablet 1   pravastatin (PRAVACHOL) 40 MG tablet Take 1 tablet (40 mg total) by mouth daily. 90 tablet 1   SYNJARDY XR 25-1000 MG TB24 Take 1 tablet by mouth daily. 90 tablet 1  valsartan-hydrochlorothiazide (DIOVAN-HCT) 160-25 MG tablet Take 1 tablet by mouth every morning. 90 tablet 1   No facility-administered medications prior to visit.    Allergies  Allergen Reactions   Carbamazepine Hives   Dilantin [Phenytoin Sodium Extended] Hives and Swelling    ROS See HPI    Objective:    Physical Exam Constitutional:      General: She is not in acute distress.    Appearance: Normal appearance. She is well-developed.  HENT:     Head: Normocephalic and atraumatic.     Right Ear: External ear normal.     Left Ear: External ear normal.  Eyes:     General: No scleral icterus. Neck:     Thyroid: No thyromegaly.  Cardiovascular:     Rate and Rhythm: Normal rate and regular rhythm.     Heart sounds: Normal heart sounds. No murmur heard. Pulmonary:     Effort: Pulmonary effort is normal. No respiratory distress.     Breath sounds: Normal breath sounds. No wheezing.  Musculoskeletal:     Cervical back: Neck supple.  Skin:    General: Skin is warm and dry.  Neurological:     Mental Status: She is alert and oriented to person, place, and time.  Psychiatric:        Mood and Affect: Mood normal.        Behavior: Behavior normal.        Thought Content: Thought content normal.        Judgment: Judgment normal.      BP 132/72   Pulse 78   Resp 18   Ht 5' 4.5" (1.638 m)   Wt 197 lb 12.8 oz (89.7 kg)   SpO2 98%   BMI 33.43 kg/m  Wt Readings from Last 3 Encounters:  09/05/23 197 lb 12.8 oz (89.7 kg)  03/05/23 199 lb (90.3 kg)  12/25/22 200 lb (90.7 kg)       Assessment & Plan:   Problem List Items Addressed This Visit       Unprioritized   Trigeminal neuralgia    Improved with higher dose of gabapentin.       Relevant Medications   gabapentin  (NEURONTIN) 300 MG capsule   Hypokalemia    Continues potassium 10 mEQ bid as well as magnesium daily.       Relevant Medications   potassium chloride (KLOR-CON M10) 10 MEQ tablet   Hypertension - Primary    BP Readings from Last 3 Encounters:  09/05/23 132/72  03/05/23 126/84  12/25/22 132/86   BP stable, continue valsartan hydrochlorothiazide.        Relevant Medications   pravastatin (PRAVACHOL) 40 MG tablet   valsartan-hydrochlorothiazide (DIOVAN-HCT) 160-25 MG tablet   Other Relevant Orders   Comp Met (CMET)   Hyperparathyroidism (HCC)    She is s/p parathyroidectomy.       Hyperlipidemia    Lab Results  Component Value Date   CHOL 135 09/01/2015   HDL 60 09/01/2015   LDLCALC 67 09/01/2015   TRIG 41 09/01/2015   CHOLHDL 2.3 09/01/2015   Due for updated lipid panel.  Continues pravastatin.        Relevant Medications   pravastatin (PRAVACHOL) 40 MG tablet   valsartan-hydrochlorothiazide (DIOVAN-HCT) 160-25 MG tablet   Other Relevant Orders   Lipid panel   Comp Met (CMET)   GERD (gastroesophageal reflux disease)    Follows with GI, Dr. Dierdre Searles.  Pantoprazole changed ot omeprazole by GI due to esophageal  polyps.       Relevant Medications   omeprazole (PRILOSEC) 40 MG capsule   Diabetes type 2, controlled (HCC)    Lab Results  Component Value Date   HGBA1C 6.6 (H) 12/04/2022   HGBA1C 6.5 (H) 08/31/2015   Lab Results  Component Value Date   MICROALBUR <0.7 12/04/2022   LDLCALC 67 09/01/2015   CREATININE 0.98 12/25/2022  Stable on synjardy, continue same, update A1C.        Relevant Medications   SYNJARDY XR 25-1000 MG TB24   pravastatin (PRAVACHOL) 40 MG tablet   valsartan-hydrochlorothiazide (DIOVAN-HCT) 160-25 MG tablet   Other Relevant Orders   HgB A1c   Other Visit Diagnoses     Need for influenza vaccination       Relevant Orders   Flu Vaccine Trivalent High Dose (Fluad) (Completed)   Need for shingles vaccine       Relevant Orders    Zoster Recombinant (Shingrix ) (Completed)       I have discontinued Nelma Cofer "[name]"'s pantoprazole. I am also having her maintain her Vitamin D3, aspirin EC, Poly-Iron 150 Forte, magnesium oxide, omeprazole, Synjardy XR, pravastatin, valsartan-hydrochlorothiazide, potassium chloride, and gabapentin.  Meds ordered this encounter  Medications   DISCONTD: gabapentin (NEURONTIN) 300 MG capsule    Sig: Take 2 capsules (600 mg total) by mouth 3 (three) times daily.    Dispense:  270 capsule    Refill:  1    #270 with 1 refill per Sandford Craze secure chat    Order Specific Question:   Supervising Provider    Answer:   Danise Edge A [[id number]]   SYNJARDY XR 25-1000 MG TB24    Sig: Take 1 tablet by mouth daily.    Dispense:  90 tablet    Refill:  1    Order Specific Question:   Supervising Provider    Answer:   Danise Edge A [[id number]]   pravastatin (PRAVACHOL) 40 MG tablet    Sig: Take 1 tablet (40 mg total) by mouth daily.    Dispense:  90 tablet    Refill:  1    Order Specific Question:   Supervising Provider    Answer:   Danise Edge A [[id number]]   valsartan-hydrochlorothiazide (DIOVAN-HCT) 160-25 MG tablet    Sig: Take 1 tablet by mouth every morning.    Dispense:  90 tablet    Refill:  1    Order Specific Question:   Supervising Provider    Answer:   Danise Edge A [[id number]]   potassium chloride (KLOR-CON M10) 10 MEQ tablet    Sig: Take 1 tablet (10 mEq total) by mouth 2 (two) times daily.    Dispense:  180 tablet    Refill:  1    Order Specific Question:   Supervising Provider    Answer:   Danise Edge A [[id number]]   gabapentin (NEURONTIN) 300 MG capsule    Sig: Take 2 capsules (600 mg total) by mouth 3 (three) times daily.    Dispense:  360 capsule    Refill:  1    Order Specific Question:   Supervising Provider    Answer:   Danise Edge A [[id number]]

## 2023-09-05 NOTE — Assessment & Plan Note (Signed)
Lab Results  Component Value Date   CHOL 135 09/01/2015   HDL 60 09/01/2015   LDLCALC 67 09/01/2015   TRIG 41 09/01/2015   CHOLHDL 2.3 09/01/2015   Due for updated lipid panel.  Continues pravastatin.

## 2023-09-05 NOTE — Assessment & Plan Note (Signed)
Follows with GI, Dr. Dierdre Searles.  Pantoprazole changed ot omeprazole by GI due to esophageal polyps.

## 2023-09-05 NOTE — Assessment & Plan Note (Signed)
BP Readings from Last 3 Encounters:  09/05/23 132/72  03/05/23 126/84  12/25/22 132/86   BP stable, continue valsartan hydrochlorothiazide.

## 2023-09-08 ENCOUNTER — Other Ambulatory Visit (HOSPITAL_BASED_OUTPATIENT_CLINIC_OR_DEPARTMENT_OTHER): Payer: Self-pay

## 2023-10-20 ENCOUNTER — Encounter: Payer: Self-pay | Admitting: Family

## 2023-10-20 NOTE — Telephone Encounter (Signed)
Patient was scheduled to come in tomorrow 4:20 pm

## 2023-10-21 ENCOUNTER — Other Ambulatory Visit (HOSPITAL_BASED_OUTPATIENT_CLINIC_OR_DEPARTMENT_OTHER): Payer: Self-pay

## 2023-10-21 ENCOUNTER — Ambulatory Visit: Payer: BC Managed Care – PPO | Admitting: Family

## 2023-10-21 VITALS — BP 110/76 | HR 96 | Temp 98.9°F | Resp 16 | Ht 64.5 in | Wt 197.0 lb

## 2023-10-21 DIAGNOSIS — G500 Trigeminal neuralgia: Secondary | ICD-10-CM | POA: Diagnosis not present

## 2023-10-21 DIAGNOSIS — R21 Rash and other nonspecific skin eruption: Secondary | ICD-10-CM | POA: Diagnosis not present

## 2023-10-21 MED ORDER — POLY-IRON 150 FORTE 150-25-1 MG-MCG-MG PO CAPS
1.0000 | ORAL_CAPSULE | Freq: Every day | ORAL | 1 refills | Status: DC
  Filled 2023-10-21: qty 100, 100d supply, fill #0
  Filled 2024-02-10: qty 100, 100d supply, fill #1

## 2023-10-21 MED ORDER — METHYLPREDNISOLONE SODIUM SUCC 125 MG IJ SOLR
125.0000 mg | Freq: Once | INTRAMUSCULAR | Status: AC
Start: 2023-10-21 — End: 2023-10-21
  Administered 2023-10-21: 125 mg via INTRAMUSCULAR

## 2023-10-21 MED ORDER — METHYLPREDNISOLONE SODIUM SUCC 125 MG IJ SOLR
125.0000 mg | Freq: Once | INTRAMUSCULAR | Status: DC
Start: 2023-10-21 — End: 2023-10-21

## 2023-10-21 MED ORDER — MAGNESIUM OXIDE 400 MG PO TABS
400.0000 mg | ORAL_TABLET | Freq: Every day | ORAL | 1 refills | Status: DC
  Filled 2023-10-21: qty 120, 120d supply, fill #0
  Filled 2024-02-14: qty 120, 120d supply, fill #1

## 2023-10-21 MED ORDER — PREDNISONE 10 MG PO TABS
ORAL_TABLET | ORAL | 0 refills | Status: AC
Start: 2023-10-21 — End: 2023-10-29
  Filled 2023-10-21: qty 20, 8d supply, fill #0

## 2023-10-21 NOTE — Progress Notes (Unsigned)
Subjective:     Patient ID: Andrea Melton, female    DOB: [birth year], 67 y.o.   MRN: [id number]  Chief Complaint  Patient presents with   Rash    Complains of rash throughout     HPI  Discussed the use of AI scribe software for clinical note transcription with the patient, who gave verbal consent to proceed.  History of Present Illness   The patient presents with a pruritic rash/hives that started on Sunday 12/1. She describes the hives as itchy and notes that her knuckles are swollen. She has been taking Claritin, which was previously prescribed by a dermatologist, to help manage the symptoms. The patient believes the hives are a reaction to Lamictal, a medication she was recently prescribed for trigeminal neuralgia. She notes that the reaction is similar to a previous reaction she had to carbamazepine twenty five years ago. The patient has stopped taking the Lamictal. She denies any tongue or lip swelling or shortness of breath.  She notes worsening of her trigeminal neuralgia pain since discontinuing lamictal. She has tried several medications, including gabapentin, Lyrica, and baclofen, but has not found sufficient relief. The Lamictal was helping, but she can no longer take it due to the hives. She is scheduled for microvascular decompression surgery on Friday to address the nerve pain.          Health Maintenance Due  Topic Date Due   MAMMOGRAM  Never done   DEXA SCAN  Never done   COVID-19 Vaccine (3 - Moderna risk series) 03/13/2020    Past Medical History:  Diagnosis Date   Acute renal failure (ARF) (HCC)    Allergy    Anemia    Bilateral carpal tunnel syndrome    Colon polyp    Diabetes type 2, controlled (HCC) 12/04/2022   GERD (gastroesophageal reflux disease)    Hydronephrosis of right kidney    Hyperlipidemia    Hyperparathyroidism (HCC)    Hypertension    Kidney stone    OSA (obstructive sleep apnea)    Osteoarthritis    Osteopenia    Parathyroid  adenoma    s/p excision   Sepsis due to Gram negative bacteria (HCC) 09/01/2015   Trigeminal neuralgia    Urticaria    Vitamin D deficiency     Past Surgical History:  Procedure Laterality Date   APPENDECTOMY  2020   CESAREAN SECTION  1980 1989   FACIAL NERVE SURGERY  11/18/1998   Gammaknife   PARATHYROIDECTOMY     20 22   right total hip Right 2019    Family History  Problem Relation Age of Onset   Hypertension Mother    Stroke Mother    Heart disease Father    Hypertension Father    Stroke Father    Depression Maternal Grandmother    Hypertension Brother    Kidney disease Brother        Kidney Stones    Cancer Sister        Breast/Uterine    Hypertension Sister    Kidney disease Sister        Kidney Stone    Cancer Sister        Breast    Hypertension Sister     Social History   Socioeconomic History   Marital status: Married    Spouse name: [name]    Number of children: 3   Years of education: 16   Highest education level: Not on file  Occupational History  Occupation: Proofreader: OTHER    Comment: [employer]   Tobacco Use   Smoking status: Former    Current packs/day: 0.00    Average packs/day: 0.3 packs/day for 5.0 years (1.3 ttl pk-yrs)    Types: Cigarettes    Start date: 11/19/1963    Quit date: 11/18/1968    Years since quitting: 54.9   Smokeless tobacco: Never  Substance and Sexual Activity   Alcohol use: Yes    Alcohol/week: 2.0 standard drinks of alcohol    Types: 2 Glasses of wine per week    Comment: Twice Weekly    Drug use: No   Sexual activity: Yes    Partners: Male  Other Topics Concern   Not on file  Social History Narrative   Marital Status:  Married [name])    Children:  Daughters (3)    Pets: Cats (3)    Living Situation: Lives with  husband and 2 daughters   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    Alcohol Use:  Twice weekly (2 glasses of wine)    Drug Use:  None   Diet:  RegularExercise:  Cardio and weights three times weekly   Hobbies:  Sewing, Reading and gardening       Social Determinants of Health   Financial Resource Strain: Not on file  Food Insecurity: Not on file  Transportation Needs: Not on file  Physical Activity: Not on file  Stress: Not on file  Social Connections: Not on file  Intimate Partner Violence: Not on file    Outpatient Medications Prior to Visit  Medication Sig Dispense Refill   aspirin EC 81 MG tablet Take 81 mg by mouth daily.     baclofen (LIORESAL) 10 MG tablet Take by mouth.     Cholecalciferol (VITAMIN D3) 25 MCG (1000 UT) CAPS Take 1 capsule (1,000 Units total) by mouth daily. 60 capsule    gabapentin (NEURONTIN) 300 MG capsule Take 2 capsules (600 mg total) by mouth 3 (three) times daily. 360 capsule 1   omeprazole (PRILOSEC) 40 MG capsule Take 40 mg by mouth 2 (two) times daily.     potassium chloride (KLOR-CON M10) 10 MEQ tablet Take 1 tablet (10 mEq total) by mouth 2 (two) times daily. 180 tablet 1   pravastatin (PRAVACHOL) 40 MG tablet Take 1 tablet (40 mg total) by mouth daily. 90 tablet 1   SYNJARDY XR 25-1000 MG TB24 Take 1 tablet by mouth daily. 90 tablet 1   valsartan-hydrochlorothiazide (DIOVAN-HCT) 160-25 MG tablet Take 1 tablet by mouth every morning. 90 tablet 1   Iron Polysacch Cmplx-B12-FA (POLY-IRON 150 FORTE) 150-0.025-1 MG CAPS Take 1 capsule by mouth daily. 100 capsule 1   magnesium oxide (MAG-OX) 400 MG tablet Take 1 tablet (400 mg total) by mouth daily. 120 tablet 0   lamoTRIgine (LAMICTAL) 200 MG tablet Take by mouth. (Patient not taking: Reported on 10/21/2023)     No facility-administered medications prior to visit.    Allergies  Allergen Reactions   Carbamazepine Hives   Dilantin [Phenytoin Sodium Extended] Hives and Swelling   Lamictal  [Lamotrigine]     Severe skin rash    ROS     Objective:    Physical Exam Constitutional:      General: She is not  in acute distress.    Appearance: Normal appearance. She is well-developed.  HENT:     Head: Normocephalic and atraumatic.     Right Ear: External ear normal.     Left Ear: External ear normal.  Eyes:     General: No scleral icterus. Neck:     Thyroid: No thyromegaly.  Cardiovascular:     Rate and Rhythm: Normal rate.  Pulmonary:     Effort: Pulmonary effort is normal.  Musculoskeletal:     Cervical back: Neck supple.  Skin:    General: Skin is warm and dry.     Comments: Erythematous macular rash noted on trunk, arms/legs  Neurological:     Mental Status: She is alert and oriented to person, place, and time.  Psychiatric:        Mood and Affect: Mood normal.        Behavior: Behavior normal.        Thought Content: Thought content normal.        Judgment: Judgment normal.       BP 110/76 (BP Location: Right Arm, Patient Position: Sitting, Cuff Size: Large)   Pulse 96   Temp 98.9 F (37.2 C) (Oral)   Resp 16   Ht 5' 4.5" (1.638 m)   Wt 197 lb (89.4 kg)   SpO2 99%   BMI 33.29 kg/m  Wt Readings from Last 3 Encounters:  10/21/23 197 lb (89.4 kg)  09/05/23 197 lb 12.8 oz (89.7 kg)  03/05/23 199 lb (90.3 kg)       Assessment & Plan:   Problem List Items Addressed This Visit       Unprioritized   Trigeminal neuralgia    She is scheduled for surgery next week. I expect rash to be significantly improved by then.       Relevant Medications   baclofen (LIORESAL) 10 MG tablet   Rash - Primary     Widespread hives with associated pruritus and swelling. No respiratory symptoms. Lamictal has been discontinued. -Administer Solu-Medrol injection today. -Start Prednisone taper tomorrow. -continue claritin       I have discontinued Darbie Bauder "[name]"'s lamoTRIgine. I am also having her start on predniSONE. Additionally, I am having her  maintain her Vitamin D3, aspirin EC, omeprazole, Synjardy XR, pravastatin, valsartan-hydrochlorothiazide, potassium chloride, gabapentin, baclofen, Poly-Iron 150 Forte, and magnesium oxide. We administered methylPREDNISolone sodium succinate.  Meds ordered this encounter  Medications   predniSONE (DELTASONE) 10 MG tablet    Sig: Take 4 tablets (40 mg total) by mouth daily for 2 days, THEN 3 tablets (30 mg total) daily for 2 days, THEN 2 tablets (20 mg total) daily for 2 days, THEN 1 tablet (10 mg total) daily for 2 days.    Dispense:  20 tablet    Refill:  0    Order Specific Question:   Supervising Provider    Answer:   Danise Edge A [[id number]]   Iron Polysacch Cmplx-B12-FA (POLY-IRON 150 FORTE) 150-0.025-1 MG CAPS    Sig: Take 1 capsule by mouth daily.    Dispense:  100 capsule    Refill:  1    Order Specific Question:   Supervising Provider    Answer:   Danise Edge A [[id number]]   magnesium oxide (MAG-OX) 400 MG tablet    Sig: Take 1 tablet (400 mg total) by mouth daily.    Dispense:  120 tablet    Refill:  1    Order Specific Question:   Supervising Provider  Answer:   Bradd Canary [[id number]]   DISCONTD: methylPREDNISolone sodium succinate (SOLU-MEDROL) 125 mg/2 mL injection 125 mg   methylPREDNISolone sodium succinate (SOLU-MEDROL) 125 mg/2 mL injection 125 mg

## 2023-10-22 ENCOUNTER — Other Ambulatory Visit (HOSPITAL_BASED_OUTPATIENT_CLINIC_OR_DEPARTMENT_OTHER): Payer: Self-pay

## 2023-10-22 DIAGNOSIS — R21 Rash and other nonspecific skin eruption: Secondary | ICD-10-CM | POA: Insufficient documentation

## 2023-10-22 NOTE — Assessment & Plan Note (Signed)
  Widespread hives with associated pruritus and swelling. No respiratory symptoms. Lamictal has been discontinued. -Administer Solu-Medrol injection today. -Start Prednisone taper tomorrow. -continue claritin

## 2023-10-22 NOTE — Patient Instructions (Signed)
VISIT SUMMARY:  Today, we addressed your recent flare of hives, which you believe is a reaction to Lamictal, a medication you were taking for nerve pain. We also discussed your ongoing nerve pain and your upcoming microvascular decompression surgery. Additionally, we refilled your Iron and Magnesium prescriptions.  YOUR PLAN:  -DRUG-INDUCED RASH (LAMICTAL): You have developed widespread hives, which are itchy and have caused swelling, likely due to Lamictal. This medication has been discontinued. To manage the hives, you received a Solu-Medrol injection today and will start a Prednisone taper tomorrow. Please also continue claritin.  -UPCOMING SURGERY (MICROVASCULAR DECOMPRESSION): We discussed the potential impact of your rash on your scheduled surgery. We anticipate significant improvement in your condition by the surgery date, so you should continue with the current plan for surgery.  INSTRUCTIONS:  Please start the Prednisone taper tomorrow as prescribed. Continue with your current medications and proceed with your scheduled microvascular decompression surgery on Friday. If you experience any new symptoms or if your condition worsens, please contact our office immediately.

## 2023-10-22 NOTE — Assessment & Plan Note (Signed)
She is scheduled for surgery next week. I expect rash to be significantly improved by then.

## 2023-10-23 ENCOUNTER — Other Ambulatory Visit (HOSPITAL_BASED_OUTPATIENT_CLINIC_OR_DEPARTMENT_OTHER): Payer: Self-pay

## 2023-10-24 HISTORY — PX: OTHER SURGICAL HISTORY: SHX169

## 2023-10-27 ENCOUNTER — Telehealth: Payer: Self-pay

## 2023-10-27 NOTE — Transitions of Care (Post Inpatient/ED Visit) (Signed)
10/27/2023  Name: Andrea Melton MRN: [id number] DOB: 09-23-1956  Today's TOC FU Call Status: Today's TOC FU Call Status:: Successful TOC FU Call Completed TOC FU Call Complete Date: 10/27/23 Patient's Name and Date of Birth confirmed.  Transition Care Management Follow-up Telephone Call Date of Discharge: 10/26/23 Discharge Facility: Other [facility]) Name of Other (Non[facility]) Discharge Facility: [facility] Type of Discharge: Inpatient Admission Primary Inpatient Discharge Diagnosis:: Trigeminal Neuralgia Right Side of Face; Microvascular Decompression How have you been since you were released from the hospital?: Better Any questions or concerns?: Yes Patient Questions/Concerns:: Patient noted she is worried about the incision on the side of her head and showering. Patient Questions/Concerns Addressed: Other: (Discussion with patient about showering and advised patient if she has any concerns after she showers, she should call her surgeon, Dr. Angelyn Punt.)  Items Reviewed: Did you receive and understand the discharge instructions provided?: Yes Medications obtained,verified, and reconciled?: Yes (Medications Reviewed) Any new allergies since your discharge?: No Dietary orders reviewed?: No Do you have support at home?: Yes People in Home: spouse Name of Support/Comfort Primary Source: [name]  Medications Reviewed Today: Medications Reviewed Today     Reviewed by Jodelle Gross, RN (Case Manager) on 10/27/23 at 1317  Med List Status: <None>   Medication Order Taking? Sig Documenting Provider Last Dose Status Informant  aspirin EC 81 MG tablet [id number] Yes Take 81 mg by mouth daily. [provider] Taking Active   Cholecalciferol (VITAMIN D3) 25 MCG (1000 UT) CAPS [id number] Yes Take 1 capsule (1,000 Units total) by mouth daily. Sandford Craze, NP Taking Active   gabapentin (NEURONTIN) 300 MG capsule [id number] Yes Take 2 capsules (600 mg total) by mouth 3  (three) times daily. Sandford Craze, NP Taking Active   Iron Polysacch Cmplx-B12-FA (POLY-IRON 150 FORTE) 150-0.025-1 MG CAPS [id number] Yes Take 1 capsule by mouth daily. Sandford Craze, NP Taking Active   magnesium oxide (MAG-OX) 400 MG tablet [id number] Yes Take 1 tablet (400 mg total) by mouth daily. Sandford Craze, NP Taking Active   omeprazole (PRILOSEC) 40 MG capsule [id number] Yes Take 40 mg by mouth 2 (two) times daily. [provider] Taking Active   potassium chloride (KLOR-CON M10) 10 MEQ tablet [id number] Yes Take 1 tablet (10 mEq total) by mouth 2 (two) times daily. Sandford Craze, NP Taking Active   pravastatin (PRAVACHOL) 40 MG tablet [id number] Yes Take 1 tablet (40 mg total) by mouth daily. Sandford Craze, NP Taking Active   predniSONE (DELTASONE) 10 MG tablet [id number] Yes Take 4 tablets (40 mg total) by mouth daily for 2 days, THEN 3 tablets (30 mg total) daily for 2 days, THEN 2 tablets (20 mg total) daily for 2 days, THEN 1 tablet (10 mg total) daily for 2 days. Sandford Craze, NP Taking Active   SYNJARDY XR 25-1000 MG TB24 [id number] Yes Take 1 tablet by mouth daily. Sandford Craze, NP Taking Active   valsartan-hydrochlorothiazide (DIOVAN-HCT) 160-25 MG tablet [id number] Yes Take 1 tablet by mouth every morning. Sandford Craze, NP Taking Active             Home Care and Equipment/Supplies: Were Home Health Services Ordered?: No Any new equipment or medical supplies ordered?: No  Functional Questionnaire: Do you need assistance with bathing/showering or dressing?: No Do you need assistance with meal preparation?: No Do you need assistance with eating?: No Do you have difficulty maintaining continence: No Do you need assistance with getting out of bed/getting out of a chair/moving?: No Do you  have difficulty managing or taking your medications?: No  Follow up appointments reviewed: PCP Follow-up appointment confirmed?:  [facility] Follow-up appointment confirmed?: Yes Date of Specialist follow-up appointment?: 11/27/23 (Patient has 11/04/23 appt. with nurse for suture removal) Follow-Up Specialty Provider:: Dr. Angelyn Punt Do you need transportation to your follow-up appointment?: No Do you understand care options if your condition(s) worsen?: Yes-patient verbalized understanding  SDOH Interventions Today    Flowsheet Row Most Recent Value  SDOH Interventions   Food Insecurity Interventions Intervention Not Indicated  Housing Interventions Intervention Not Indicated  Transportation Interventions Intervention Not Indicated  Utilities Interventions Intervention Not Indicated      Jodelle Gross RN, BSN, CCM RN Care Manager  Transitions of Care  [facility]

## 2023-12-04 ENCOUNTER — Other Ambulatory Visit: Payer: Self-pay

## 2023-12-05 ENCOUNTER — Other Ambulatory Visit (HOSPITAL_COMMUNITY): Payer: Self-pay

## 2023-12-08 ENCOUNTER — Other Ambulatory Visit: Payer: Self-pay

## 2024-01-09 ENCOUNTER — Ambulatory Visit: Payer: BC Managed Care – PPO | Admitting: Family

## 2024-01-14 ENCOUNTER — Encounter: Payer: Self-pay | Admitting: Family

## 2024-01-14 ENCOUNTER — Ambulatory Visit (INDEPENDENT_AMBULATORY_CARE_PROVIDER_SITE_OTHER): Payer: 59 | Admitting: Family

## 2024-01-14 VITALS — BP 133/82 | HR 81 | Temp 97.5°F | Resp 16 | Ht 64.5 in | Wt 191.0 lb

## 2024-01-14 DIAGNOSIS — E876 Hypokalemia: Secondary | ICD-10-CM

## 2024-01-14 DIAGNOSIS — G4733 Obstructive sleep apnea (adult) (pediatric): Secondary | ICD-10-CM

## 2024-01-14 DIAGNOSIS — M8580 Other specified disorders of bone density and structure, unspecified site: Secondary | ICD-10-CM

## 2024-01-14 DIAGNOSIS — E119 Type 2 diabetes mellitus without complications: Secondary | ICD-10-CM | POA: Diagnosis not present

## 2024-01-14 DIAGNOSIS — Z0000 Encounter for general adult medical examination without abnormal findings: Secondary | ICD-10-CM

## 2024-01-14 DIAGNOSIS — I10 Essential (primary) hypertension: Secondary | ICD-10-CM

## 2024-01-14 DIAGNOSIS — E559 Vitamin D deficiency, unspecified: Secondary | ICD-10-CM | POA: Diagnosis not present

## 2024-01-14 DIAGNOSIS — G500 Trigeminal neuralgia: Secondary | ICD-10-CM | POA: Diagnosis not present

## 2024-01-14 DIAGNOSIS — Z7985 Long-term (current) use of injectable non-insulin antidiabetic drugs: Secondary | ICD-10-CM

## 2024-01-14 DIAGNOSIS — R5383 Other fatigue: Secondary | ICD-10-CM | POA: Insufficient documentation

## 2024-01-14 DIAGNOSIS — K2270 Barrett's esophagus without dysplasia: Secondary | ICD-10-CM

## 2024-01-14 DIAGNOSIS — Z1231 Encounter for screening mammogram for malignant neoplasm of breast: Secondary | ICD-10-CM

## 2024-01-14 DIAGNOSIS — E785 Hyperlipidemia, unspecified: Secondary | ICD-10-CM

## 2024-01-14 DIAGNOSIS — D649 Anemia, unspecified: Secondary | ICD-10-CM

## 2024-01-14 DIAGNOSIS — E213 Hyperparathyroidism, unspecified: Secondary | ICD-10-CM

## 2024-01-14 LAB — COMPREHENSIVE METABOLIC PANEL
ALT: 25 U/L (ref 0–35)
AST: 18 U/L (ref 0–37)
Albumin: 4.3 g/dL (ref 3.5–5.2)
Alkaline Phosphatase: 73 U/L (ref 39–117)
BUN: 12 mg/dL (ref 6–23)
CO2: 32 meq/L (ref 19–32)
Calcium: 9.4 mg/dL (ref 8.4–10.5)
Chloride: 98 meq/L (ref 96–112)
Creatinine, Ser: 0.79 mg/dL (ref 0.40–1.20)
GFR: 76.05 mL/min (ref >60.00)
Glucose, Bld: 98 mg/dL (ref 70–99)
Potassium: 3.9 meq/L (ref 3.5–5.1)
Sodium: 138 meq/L (ref 135–145)
Total Bilirubin: 0.8 mg/dL (ref 0.2–1.2)
Total Protein: 7.1 g/dL (ref 6.0–8.3)

## 2024-01-14 LAB — HEMOGLOBIN A1C: Hgb A1c MFr Bld: 6.5 % (ref 4.6–6.5)

## 2024-01-14 LAB — CBC WITH DIFFERENTIAL/PLATELET
Basophils Absolute: 0 10*3/uL (ref 0.0–0.1)
Basophils Relative: 0.9 % (ref 0.0–3.0)
Eosinophils Absolute: 0.1 10*3/uL (ref 0.0–0.7)
Eosinophils Relative: 2.1 % (ref 0.0–5.0)
HCT: 42.1 % (ref 36.0–46.0)
Hemoglobin: 13.8 g/dL (ref 12.0–15.0)
Lymphocytes Relative: 32.6 % (ref 12.0–46.0)
Lymphs Abs: 1.7 10*3/uL (ref 0.7–4.0)
MCHC: 32.6 g/dL (ref 30.0–36.0)
MCV: 84.3 fl (ref 78.0–100.0)
Monocytes Absolute: 0.4 10*3/uL (ref 0.1–1.0)
Monocytes Relative: 7.7 % (ref 3.0–12.0)
Neutro Abs: 3 10*3/uL (ref 1.4–7.7)
Neutrophils Relative %: 56.7 % (ref 43.0–77.0)
Platelets: 265 10*3/uL (ref 150.0–400.0)
RBC: 5 Mil/uL (ref 3.87–5.11)
RDW: 14.5 % (ref 11.5–15.5)
WBC: 5.3 10*3/uL (ref 4.0–10.5)

## 2024-01-14 LAB — MICROALBUMIN / CREATININE URINE RATIO
Creatinine,U: 45.8 mg/dL
Microalb Creat Ratio: 15.3 mg/g (ref 0.0–30.0)
Microalb, Ur: <0.7 mg/dL (ref 0.0–1.9)

## 2024-01-14 NOTE — Assessment & Plan Note (Signed)
 S/p parathyroidectomy

## 2024-01-14 NOTE — Assessment & Plan Note (Signed)
 Continues Kdur.

## 2024-01-14 NOTE — Assessment & Plan Note (Addendum)
 Lab Results  Component Value Date   HGBA1C 6.4 09/05/2023   HGBA1C 6.6 (H) 12/04/2022   HGBA1C 6.5 (H) 08/31/2015   Lab Results  Component Value Date   MICROALBUR <0.7 12/04/2022   LDLCALC 90 09/05/2023   CREATININE 0.84 09/05/2023   Stable on synjardy- update A1C. We discussed GLP-1, she wishes to think about it.

## 2024-01-14 NOTE — Assessment & Plan Note (Signed)
Update vitamin D. 

## 2024-01-14 NOTE — Progress Notes (Signed)
 Subjective:     Patient ID: Andrea Melton, female    DOB: 1956/03/17, 68 y.o.   MRN: [id number]  Chief Complaint  Patient presents with   Hypertension    Here for follow up   Diabetes    Here for follow up   Fatigue    Patient complains of being tire "all the time"    Hypertension  Diabetes    Discussed the use of AI scribe software for clinical note transcription with the patient, who gave verbal consent to proceed.  History of Present Illness Andrea Koran "[name]" is a 68 year old female who presents with fatigue.  She experiences persistent fatigue, describing it as a need to return to bed upon waking. She suspects sleep apnea as a potential cause due to frequent dreaming. She previously attempted CPAP therapy but was unable to tolerate it, even after trying different masks.  Her history of trigeminal neuralgia shows improvement following craniotomy/nerve decompression in December.  Notes  the pain subsiding significantly. She still experiences a 'tingly feeling' around her eye and some numbness in the top quadrant, which she considers negligible compared to before. She has discontinued gabapentin as her symptoms have improved.  She takes 1000 units of vitamin D daily as an over-the-counter supplement. Her blood pressure is managed with valsartan, hydrochlorothiazide, and a potassium supplement, taken twice daily. Her cholesterol levels were last checked in October and were satisfactory; she is on pravastatin. Her A1c was 6.4 in October, down from 6.6, and she is on [place]. She does not have her own glucose monitor but uses her husband's or daughter's when needed. She continues to take omeprazole twice a day for reflux and Barrett's esophagus and had an endoscopy in January, with a follow-up scheduled in a year.  Her family history includes cancer, with her sister Jenel Lucks having had pancreatic and breast cancer, and her daughter having had breast cancer at age 28. Her  daughter is doing well after a total mastectomy and reconstruction.  She is planning to retire in June. She was unable to drive for six weeks following her surgery in December.      Health Maintenance Due  Topic Date Due   MAMMOGRAM  Never done   DEXA SCAN  Never done   COVID-19 Vaccine (3 - Moderna risk series) 03/13/2020   DTaP/Tdap/Td (2 - Td or Tdap) 11/04/2023   OPHTHALMOLOGY EXAM  11/29/2023   Diabetic kidney evaluation - Urine ACR  12/05/2023    Past Medical History:  Diagnosis Date   Acute renal failure (ARF) (HCC)    Allergy    Anemia    Bilateral carpal tunnel syndrome    Colon polyp    Diabetes type 2, controlled (HCC) 12/04/2022   GERD (gastroesophageal reflux disease)    Hydronephrosis of right kidney    Hyperlipidemia    Hyperparathyroidism (HCC)    Hypertension    Kidney stone    OSA (obstructive sleep apnea)    Osteoarthritis    Osteopenia    Parathyroid adenoma    s/p excision   Sepsis due to Gram negative bacteria (HCC) 09/01/2015   Trigeminal neuralgia    Urticaria    Vitamin D deficiency     Past Surgical History:  Procedure Laterality Date   APPENDECTOMY  2020   CESAREAN SECTION  1980 1989   craniotomy microvascular decompression  10/24/2023   [facility], for trigeminal neuralgia   FACIAL NERVE SURGERY  11/18/1998   [facility]   PARATHYROIDECTOMY  2022   right total hip Right 2019    Family History  Problem Relation Age of Onset   Hypertension Mother    Stroke Mother    Heart disease Father    Hypertension Father    Stroke Father    Cancer Sister        Breast/Uterine    Hypertension Sister    Kidney disease Sister        Kidney Stone    Cancer Sister        Breast    Hypertension Sister    Pancreatic cancer Sister    Hypertension Brother    Kidney disease Brother        Kidney Stones    Depression Maternal Grandmother    Breast cancer Daughter 19    Social History   Socioeconomic History   Marital status: Married     Spouse name: [name]    Number of children: 3   Years of education: 16   Highest education level: Not on file  Occupational History   Occupation: ASSISTANT DIRECTOR     Employer: OTHER    Comment: [employer]   Tobacco Use   Smoking status: Former    Current packs/day: 0.00    Average packs/day: 0.3 packs/day for 5.0 years (1.3 ttl pk-yrs)    Types: Cigarettes    Start date: 11/19/1963    Quit date: 11/18/1968    Years since quitting: 55.1   Smokeless tobacco: Never  Substance and Sexual Activity   Alcohol use: Yes    Alcohol/week: 2.0 standard drinks of alcohol    Types: 2 Glasses of wine per week    Comment: Twice Weekly    Drug use: No   Sexual activity: Yes    Partners: Male  Other Topics Concern   Not on file  Social History Narrative   Marital Status:  Married [name])    Children:  Daughters (3)    Pets: Cats (3)    Living Situation: Lives with  husband and 2 daughters   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    Alcohol Use:  Twice weekly (2 glasses of wine)    Drug Use:  None   Diet:  RegularExercise:  Cardio and weights three times weekly   Hobbies:  Sewing, Reading and gardening       Social Drivers of Corporate investment banker Strain: Not on file  Food Insecurity: No Food Insecurity (10/27/2023)   Hunger Vital Sign    Worried About Running Out of Food in the Last Year: Never true    Ran Out of Food in the Last Year: Never true  Transportation Needs: No Transportation Needs (10/27/2023)   PRAPARE - Administrator, Civil Service (Medical): No    Lack of Transportation (Non-Medical): No  Physical Activity: Not on file  Stress: Not on file  Social Connections: Not on file  Intimate Partner Violence: Not At Risk (10/27/2023)   Humiliation, Afraid, Rape, and Kick  questionnaire    Fear of Current or Ex-Partner: No    Emotionally Abused: No    Physically Abused: No    Sexually Abused: No    Outpatient Medications Prior to Visit  Medication Sig Dispense Refill   aspirin EC 81 MG tablet Take 81 mg by mouth daily.  Cholecalciferol (VITAMIN D3) 25 MCG (1000 UT) CAPS Take 1 capsule (1,000 Units total) by mouth daily. 60 capsule    Iron Polysacch Cmplx-B12-FA (POLY-IRON 150 FORTE) 150-0.025-1 MG CAPS Take 1 capsule by mouth daily. 100 capsule 1   magnesium oxide (MAG-OX) 400 MG tablet Take 1 tablet (400 mg total) by mouth daily. 120 tablet 1   omeprazole (PRILOSEC) 40 MG capsule Take 40 mg by mouth 2 (two) times daily.     potassium chloride (KLOR-CON M10) 10 MEQ tablet Take 1 tablet (10 mEq total) by mouth 2 (two) times daily. 180 tablet 1   pravastatin (PRAVACHOL) 40 MG tablet Take 1 tablet (40 mg total) by mouth daily. 90 tablet 1   SYNJARDY XR 25-1000 MG TB24 Take 1 tablet by mouth daily. 90 tablet 1   valsartan-hydrochlorothiazide (DIOVAN-HCT) 160-25 MG tablet Take 1 tablet by mouth every morning. 90 tablet 1   gabapentin (NEURONTIN) 300 MG capsule Take 2 capsules (600 mg total) by mouth 3 (three) times daily. 360 capsule 1   No facility-administered medications prior to visit.    Allergies  Allergen Reactions   Carbamazepine Hives   Dilantin [Phenytoin Sodium Extended] Hives and Swelling   Lamictal [Lamotrigine]     Severe skin rash    ROS See HPI    Objective:    Physical Exam Constitutional:      General: She is not in acute distress.    Appearance: Normal appearance. She is well-developed.  HENT:     Head: Normocephalic and atraumatic.     Right Ear: External ear normal.     Left Ear: External ear normal.  Eyes:     General: No scleral icterus. Neck:     Thyroid: No thyromegaly.  Cardiovascular:     Rate and Rhythm: Normal rate and regular rhythm.     Heart sounds: Normal heart sounds. No murmur heard. Pulmonary:      Effort: Pulmonary effort is normal. No respiratory distress.     Breath sounds: Normal breath sounds. No wheezing.  Musculoskeletal:     Cervical back: Neck supple.  Skin:    General: Skin is warm and dry.  Neurological:     Mental Status: She is alert and oriented to person, place, and time.  Psychiatric:        Mood and Affect: Mood normal.        Behavior: Behavior normal.        Thought Content: Thought content normal.        Judgment: Judgment normal.      BP 133/82 (BP Location: Right Arm, Patient Position: Sitting, Cuff Size: Normal)   Pulse 81   Temp (!) 97.5 F (36.4 C) (Oral)   Resp 16   Ht 5' 4.5" (1.638 m)   Wt 191 lb (86.6 kg)   SpO2 99%   BMI 32.28 kg/m  Wt Readings from Last 3 Encounters:  01/14/24 191 lb (86.6 kg)  10/21/23 197 lb (89.4 kg)  09/05/23 197 lb 12.8 oz (89.7 kg)       Assessment & Plan:   Problem List Items Addressed This Visit       Unprioritized   Vitamin D deficiency - Primary   Update vitamin D.      Trigeminal neuralgia   S/p craniotomy with microvascular decompression in December. No longer needing gabapentin.        Preventative health care   Relevant Orders   Ambulatory referral to Dermatology   Osteopenia   Last  dexa 2022, osteopenia, will update.      Relevant Orders   DG Bone Density   OSA (obstructive sleep apnea)   Has declined oral appliance, did not tolerate CPAP.       Relevant Orders   Ambulatory referral to Orthodontics   Hypokalemia   Continues Kdur.       Hypertension   BP Readings from Last 3 Encounters:  01/14/24 133/82  10/21/23 110/76  09/05/23 132/72   Stable on valsartan hydrochlorothiazide, continue same.       Hyperparathyroidism [facility])   S/p parathyroid ectomy.       Hyperlipidemia   Lab Results  Component Value Date   CHOL 184 09/05/2023   HDL 78.50 09/05/2023   LDLCALC 90 09/05/2023   TRIG 80.0 09/05/2023   CHOLHDL 2 09/05/2023   At goal on pravastatin, continue same.       Fatigue   Most likely due to untreated OSA. Will refer to Orthodontics to discuss oral appliance.      Relevant Orders   Comp Met (CMET)   CBC w/Diff   Diabetes type 2, controlled (HCC)   Lab Results  Component Value Date   HGBA1C 6.4 09/05/2023   HGBA1C 6.6 (H) 12/04/2022   HGBA1C 6.5 (H) 08/31/2015   Lab Results  Component Value Date   MICROALBUR <0.7 12/04/2022   LDLCALC 90 09/05/2023   CREATININE 0.84 09/05/2023   Stable on synjardy- update A1C. We discussed GLP-1, she wishes to think about it.       Relevant Orders   HgB A1c   Urine Microalbumin w/creat. ratio   Barrett's esophagus   Continues omeprazole 40mg  bid. Last endo was in [place] will follow back up in 1 year.       Anemia   Lab Results  Component Value Date   WBC 5.6 12/25/2022   HGB 13.2 12/25/2022   HCT 41.7 12/25/2022   MCV 83 12/25/2022   PLT 295 12/25/2022         Other Visit Diagnoses       Breast cancer screening by mammogram       Relevant Orders   MM 3D SCREENING MAMMOGRAM BILATERAL BREAST       I have discontinued Andrea Prezioso "[name]"'s gabapentin. I am also having her maintain her Vitamin D3, aspirin EC, omeprazole, Synjardy XR, pravastatin, valsartan-hydrochlorothiazide, potassium chloride, Poly-Iron 150 Forte, and magnesium oxide.  No orders of the defined types were placed in this encounter.

## 2024-01-14 NOTE — Assessment & Plan Note (Signed)
 Lab Results  Component Value Date   WBC 5.6 12/25/2022   HGB 13.2 12/25/2022   HCT 41.7 12/25/2022   MCV 83 12/25/2022   PLT 295 12/25/2022

## 2024-01-14 NOTE — Assessment & Plan Note (Signed)
 Most likely due to untreated OSA. Will refer to Orthodontics to discuss oral appliance.

## 2024-01-14 NOTE — Assessment & Plan Note (Addendum)
 Last dexa 2022, osteopenia, will update.

## 2024-01-14 NOTE — Assessment & Plan Note (Addendum)
 S/p craniotomy with microvascular decompression in December. No longer needing gabapentin.

## 2024-01-14 NOTE — Patient Instructions (Signed)
 VISIT SUMMARY:  Today, we discussed your persistent fatigue and potential causes, including sleep apnea. We reviewed your history of trigeminal neuralgia, which has improved, and your current medications and supplements. We also addressed your recent surgery recovery, vitamin D levels, bone health, blood pressure, cholesterol, diabetes, GERD, and family history of cancer. We planned several follow-up tests and discussed new treatment options for sleep apnea.  YOUR PLAN:  -POST-SURGICAL FOLLOW-UP: You have successfully recovered from your recent surgery with minimal residual numbness and tingling around your eye. No further action is required at this time.  -VITAMIN D DEFICIENCY: Vitamin D deficiency means your body may not have enough vitamin D, which is important for bone health. We will check your Vitamin D levels today to ensure your current supplement is effective.  -OSTEOPOROSIS: Osteoporosis is a condition where bones become weak and brittle. We will order a bone density scan to be completed this year to monitor your bone health.  -HYPERTENSION: Hypertension is high blood pressure. Your blood pressure is slightly elevated at 133/82, so you should continue your current medication regimen.  -HYPERLIPIDEMIA: Hyperlipidemia means you have high cholesterol levels. Your last cholesterol check in October showed good control, so you should continue your current medication regimen.  -TYPE 2 DIABETES MELLITUS: Type 2 Diabetes Mellitus is a condition where your body does not use insulin properly. Your last A1c in October was 6.4, down from 6.6. We will check your A1c today and you should continue your current medication regimen.  -GASTROESOPHAGEAL REFLUX DISEASE (GERD) AND BARRETT'S ESOPHAGUS: GERD is a condition where stomach acid frequently flows back into the tube connecting your mouth and stomach, and Barrett's Esophagus is a complication of GERD. You should continue taking omeprazole twice daily  and have regular follow-ups with gastroenterology.  -SLEEP APNEA: Sleep apnea is a sleep disorder where breathing repeatedly stops and starts. We discussed potential use of an oral appliance or implantable device for managing your sleep apnea. Please consider these options.  -FAMILY HISTORY OF CANCER: Given your family history of pancreatic, breast, and ovarian cancer, we will check your liver function tests today to screen for potential abnormalities.  -GENERAL HEALTH MAINTENANCE: We will order a mammogram to be completed this year and an eye exam to be completed next month. We also discussed considering a dermatology consultation for a full skin check and the potential use of a weight loss injection (Mounjaro) for managing sleep apnea and diabetes in the future.  INSTRUCTIONS:  Please follow up in 4 months. Today, we will check your Vitamin D levels, A1c, and liver function tests. Complete a bone density scan this year, a mammogram this year, and an eye exam next month. Consider the discussed options for managing sleep apnea and consult dermatology for a full skin check.

## 2024-01-14 NOTE — Assessment & Plan Note (Addendum)
 BP Readings from Last 3 Encounters:  01/14/24 133/82  10/21/23 110/76  09/05/23 132/72   Stable on valsartan hydrochlorothiazide, continue same.

## 2024-01-14 NOTE — Assessment & Plan Note (Signed)
 Has declined oral appliance, did not tolerate CPAP.

## 2024-01-14 NOTE — Assessment & Plan Note (Signed)
 Lab Results  Component Value Date   CHOL 184 09/05/2023   HDL 78.50 09/05/2023   LDLCALC 90 09/05/2023   TRIG 80.0 09/05/2023   CHOLHDL 2 09/05/2023   At goal on pravastatin, continue same.

## 2024-01-14 NOTE — Assessment & Plan Note (Signed)
 Continues omeprazole 40mg  bid. Last endo was in [place] will follow back up in 1 year.

## 2024-01-20 ENCOUNTER — Encounter (HOSPITAL_BASED_OUTPATIENT_CLINIC_OR_DEPARTMENT_OTHER): Payer: Self-pay

## 2024-01-20 ENCOUNTER — Ambulatory Visit (HOSPITAL_BASED_OUTPATIENT_CLINIC_OR_DEPARTMENT_OTHER)
Admission: RE | Admit: 2024-01-20 | Discharge: 2024-01-20 | Disposition: A | Discharge status: Home / Self Care | Payer: 59 | Source: Ambulatory Visit | Attending: Family | Admitting: Family

## 2024-01-20 ENCOUNTER — Encounter: Payer: Self-pay | Admitting: Family

## 2024-01-20 DIAGNOSIS — M8580 Other specified disorders of bone density and structure, unspecified site: Secondary | ICD-10-CM

## 2024-01-20 DIAGNOSIS — Z1231 Encounter for screening mammogram for malignant neoplasm of breast: Secondary | ICD-10-CM | POA: Insufficient documentation

## 2024-02-03 ENCOUNTER — Encounter: Payer: Self-pay | Admitting: Family

## 2024-02-03 LAB — HM DIABETES EYE EXAM

## 2024-02-16 ENCOUNTER — Other Ambulatory Visit (HOSPITAL_BASED_OUTPATIENT_CLINIC_OR_DEPARTMENT_OTHER): Payer: Self-pay

## 2024-02-16 ENCOUNTER — Telehealth: Payer: Self-pay | Admitting: Family

## 2024-02-16 NOTE — Telephone Encounter (Signed)
 Electronic request made

## 2024-02-16 NOTE — Telephone Encounter (Addendum)
 Please request DM eye exam from  Dr. Carlynn Purl [facility].

## 2024-02-18 ENCOUNTER — Other Ambulatory Visit: Payer: Self-pay

## 2024-03-02 ENCOUNTER — Other Ambulatory Visit: Payer: Self-pay | Admitting: Family

## 2024-03-02 DIAGNOSIS — E785 Hyperlipidemia, unspecified: Secondary | ICD-10-CM

## 2024-03-02 DIAGNOSIS — E876 Hypokalemia: Secondary | ICD-10-CM

## 2024-03-02 DIAGNOSIS — E119 Type 2 diabetes mellitus without complications: Secondary | ICD-10-CM

## 2024-03-02 DIAGNOSIS — I10 Essential (primary) hypertension: Secondary | ICD-10-CM

## 2024-03-03 ENCOUNTER — Telehealth: Payer: Self-pay | Admitting: Family

## 2024-03-03 ENCOUNTER — Other Ambulatory Visit: Payer: Self-pay

## 2024-03-03 ENCOUNTER — Other Ambulatory Visit (HOSPITAL_COMMUNITY): Payer: Self-pay

## 2024-03-03 MED ORDER — IRON (FERROUS SULFATE) 325 (65 FE) MG PO TABS: 325.0000 mg | ORAL_TABLET | Freq: Every day | ORAL | Status: AC

## 2024-03-03 MED ORDER — SYNJARDY XR 25-1000 MG PO TB24
1.0000 | ORAL_TABLET | Freq: Every day | ORAL | 1 refills | Status: DC
  Filled 2024-03-03: qty 90, 90d supply, fill #0
  Filled 2024-06-11: qty 90, 90d supply, fill #1

## 2024-03-03 MED ORDER — OMEPRAZOLE 40 MG PO CPDR
40.0000 mg | DELAYED_RELEASE_CAPSULE | Freq: Two times a day (BID) | ORAL | 0 refills | Status: DC
  Filled 2024-03-03: qty 180, 90d supply, fill #0

## 2024-03-03 MED ORDER — MAGNESIUM OXIDE 400 MG PO TABS
400.0000 mg | ORAL_TABLET | Freq: Every day | ORAL | 1 refills | Status: AC
  Filled 2024-03-03 – 2024-06-13 (×2): qty 120, 120d supply, fill #0
  Filled 2024-10-15: qty 120, 120d supply, fill #1

## 2024-03-03 MED ORDER — POTASSIUM CHLORIDE CRYS ER 10 MEQ PO TBCR
10.0000 meq | EXTENDED_RELEASE_TABLET | Freq: Two times a day (BID) | ORAL | 1 refills | Status: DC
  Filled 2024-03-03: qty 180, 90d supply, fill #0
  Filled 2024-05-31: qty 180, 90d supply, fill #1

## 2024-03-03 MED ORDER — VALSARTAN-HYDROCHLOROTHIAZIDE 160-25 MG PO TABS
1.0000 | ORAL_TABLET | Freq: Every morning | ORAL | 1 refills | Status: DC
  Filled 2024-03-03: qty 90, 90d supply, fill #0
  Filled 2024-05-31: qty 90, 90d supply, fill #1

## 2024-03-03 MED ORDER — PRAVASTATIN SODIUM 40 MG PO TABS
40.0000 mg | ORAL_TABLET | Freq: Every day | ORAL | 1 refills | Status: DC
Start: 2024-03-03 — End: 2024-08-18
  Filled 2024-03-03: qty 90, 90d supply, fill #0
  Filled 2024-05-31: qty 90, 90d supply, fill #1

## 2024-03-03 NOTE — Telephone Encounter (Signed)
 Patient notified of this information.

## 2024-03-03 NOTE — Telephone Encounter (Signed)
 It looks like her insurance is not covering her current iron supplement. Instead, I would recommend that she add iron 325mg  one tab by mouth once daily OTC please.

## 2024-03-04 ENCOUNTER — Other Ambulatory Visit (HOSPITAL_COMMUNITY): Payer: Self-pay

## 2024-03-29 ENCOUNTER — Other Ambulatory Visit (HOSPITAL_COMMUNITY): Payer: Self-pay

## 2024-05-14 ENCOUNTER — Telehealth: Payer: Self-pay | Admitting: Family

## 2024-05-14 ENCOUNTER — Ambulatory Visit (INDEPENDENT_AMBULATORY_CARE_PROVIDER_SITE_OTHER): Payer: 59 | Admitting: Family

## 2024-05-14 VITALS — BP 125/69 | HR 83 | Temp 98.6°F | Resp 16 | Ht 64.0 in | Wt 192.0 lb

## 2024-05-14 DIAGNOSIS — G5603 Carpal tunnel syndrome, bilateral upper limbs: Secondary | ICD-10-CM

## 2024-05-14 DIAGNOSIS — G500 Trigeminal neuralgia: Secondary | ICD-10-CM | POA: Diagnosis not present

## 2024-05-14 DIAGNOSIS — Z23 Encounter for immunization: Secondary | ICD-10-CM

## 2024-05-14 DIAGNOSIS — E119 Type 2 diabetes mellitus without complications: Secondary | ICD-10-CM | POA: Diagnosis not present

## 2024-05-14 DIAGNOSIS — E559 Vitamin D deficiency, unspecified: Secondary | ICD-10-CM

## 2024-05-14 DIAGNOSIS — K2270 Barrett's esophagus without dysplasia: Secondary | ICD-10-CM

## 2024-05-14 DIAGNOSIS — M8580 Other specified disorders of bone density and structure, unspecified site: Secondary | ICD-10-CM | POA: Diagnosis not present

## 2024-05-14 DIAGNOSIS — G4733 Obstructive sleep apnea (adult) (pediatric): Secondary | ICD-10-CM

## 2024-05-14 DIAGNOSIS — I10 Essential (primary) hypertension: Secondary | ICD-10-CM

## 2024-05-14 DIAGNOSIS — E785 Hyperlipidemia, unspecified: Secondary | ICD-10-CM

## 2024-05-14 LAB — BASIC METABOLIC PANEL WITH GFR
BUN: 16 mg/dL (ref 6–23)
CO2: 29 meq/L (ref 19–32)
Calcium: 9.4 mg/dL (ref 8.4–10.5)
Chloride: 101 meq/L (ref 96–112)
Creatinine, Ser: 0.83 mg/dL (ref 0.40–1.20)
GFR: 71.5 mL/min (ref >60.00)
Glucose, Bld: 108 mg/dL — ABNORMAL HIGH (ref 70–99)
Potassium: 3.5 meq/L (ref 3.5–5.1)
Sodium: 138 meq/L (ref 135–145)

## 2024-05-14 LAB — MICROALBUMIN / CREATININE URINE RATIO
Creatinine,U: 36.3 mg/dL
Microalb Creat Ratio: UNDETERMINED mg/g (ref 0.0–30.0)
Microalb, Ur: <0.7 mg/dL

## 2024-05-14 LAB — VITAMIN D 25 HYDROXY (VIT D DEFICIENCY, FRACTURES): VITD: 24.67 ng/mL — ABNORMAL LOW (ref 30.00–100.00)

## 2024-05-14 LAB — HEMOGLOBIN A1C: Hgb A1c MFr Bld: 6.4 % (ref 4.6–6.5)

## 2024-05-14 NOTE — Assessment & Plan Note (Signed)
 Symptoms unchanged.  Declines surgical referral.

## 2024-05-14 NOTE — Assessment & Plan Note (Signed)
 Lab Results  Component Value Date   CHOL 184 09/05/2023   HDL 78.50 09/05/2023   LDLCALC 90 09/05/2023   TRIG 80.0 09/05/2023   CHOLHDL 2 09/05/2023   Stable on pravastatin . Continue same.

## 2024-05-14 NOTE — Progress Notes (Signed)
 Subjective:     Patient ID: Andrea Melton, female    DOB: May 31, 1956, 67 y.o.   MRN: [id number]  Chief Complaint  Patient presents with   Hypertension    Here for follow up    Hypertension    Discussed the use of AI scribe software for clinical note transcription with the patient, who gave verbal consent to proceed.  History of Present Illness  Andrea Melton is a 68 year old female who presents for a routine follow-up.  Trigeminal neuralgia remains stable post-surgery. She experiences drooling while awake on the same side of her face that is slightly numb.  Osteoporosis management includes calcium and vitamin D  supplementation. She had a bone density update in March.  Diabetes is managed with Synjardia XR, with an A1c of 6.5% in February. She takes valsartan  HCT for blood pressure and pravastatin  for cholesterol, which was normal in October.  Carpal tunnel symptoms are unchanged, managed by avoiding exacerbating activities.  For GERD and Barrett's esophagus, she continues omeprazole  and had an endoscopy last January.  Fatigue has improved with better sleep. She takes over-the-counter iron  supplements due to insurance changes.     Health Maintenance Due  Topic Date Due   COVID-19 Vaccine (3 - Moderna risk series) 03/13/2020   DTaP/Tdap/Td (2 - Td or Tdap) 11/04/2023    Past Medical History:  Diagnosis Date   Acute renal failure (ARF) (HCC)    Allergy    Anemia    Bilateral carpal tunnel syndrome    Colon polyp    Diabetes type 2, controlled (HCC) 12/04/2022   GERD (gastroesophageal reflux disease)    Hydronephrosis of right kidney    Hyperlipidemia    Hyperparathyroidism (HCC)    Hypertension    Kidney stone    OSA (obstructive sleep apnea)    Osteoarthritis    Osteopenia    Parathyroid  adenoma    s/p excision   Sepsis due to Gram negative bacteria (HCC) 09/01/2015   Trigeminal neuralgia    Urticaria    Vitamin D  deficiency     Past  Surgical History:  Procedure Laterality Date   APPENDECTOMY  2020   CESAREAN SECTION  1980 1989   craniotomy microvascular decompression  10/24/2023   [facility], for trigeminal neuralgia   FACIAL NERVE SURGERY  11/18/1998   [facility]   PARATHYROIDECTOMY     2022   right total hip Right 2019    Family History  Problem Relation Age of Onset   Hypertension Mother    Stroke Mother    Heart disease Father    Hypertension Father    Stroke Father    Cancer Sister        Breast/Uterine    Hypertension Sister    Kidney disease Sister        Kidney Stone    Cancer Sister        Breast    Hypertension Sister    Pancreatic cancer Sister    Hypertension Brother    Kidney disease Brother        Kidney Stones    Depression Maternal Grandmother    Breast cancer Daughter 55    Social History   Socioeconomic History   Marital status: Married    Spouse name: Dempsey    Number of children: 3   Years of education: 16   Highest education level: Bachelor's degree (e.g., BA, AB, BS)  Occupational History   Occupation: Proofreader: OTHER  Comment: [employer]   Tobacco Use   Smoking status: Former    Current packs/day: 0.00    Average packs/day: 0.3 packs/day for 5.0 years (1.3 ttl pk-yrs)    Types: Cigarettes    Start date: 11/19/1963    Quit date: 11/18/1968    Years since quitting: 55.5   Smokeless tobacco: Never  Substance and Sexual Activity   Alcohol use: Yes    Alcohol/week: 2.0 standard drinks of alcohol    Types: 2 Glasses of wine per week    Comment: Twice Weekly    Drug use: No   Sexual activity: Yes    Partners: Male  Other Topics Concern   Not on file  Social History Narrative   Marital Status:  Married [name])    Children:  Daughters (3)    Pets: Cats (3)    Living Situation: Lives with  husband and 2 daughters   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    Alcohol Use:  Twice weekly (2 glasses of wine)    Drug Use:  None   Diet:  RegularExercise:  Cardio and weights three times weekly   Hobbies:  Sewing, Reading and gardening       Social Drivers of Health   Financial Resource Strain: Low Risk  (05/13/2024)   Overall Financial Resource Strain (CARDIA)    Difficulty of Paying Living Expenses: Not very hard  Food Insecurity: No Food Insecurity (05/13/2024)   Hunger Vital Sign    Worried About Running Out of Food in the Last Year: Never true    Ran Out of Food in the Last Year: Never true  Transportation Needs: No Transportation Needs (05/13/2024)   PRAPARE - Administrator, Civil Service (Medical): No    Lack of Transportation (Non-Medical): No  Physical Activity: Inactive (05/13/2024)   Exercise Vital Sign    Days of Exercise per Week: 0 days    Minutes of Exercise per Session: Not on file  Stress: Stress Concern Present (05/13/2024)   [employer] of Occupational Health - Occupational Stress Questionnaire    Feeling of Stress: To some extent  Social Connections: Moderately Isolated (05/13/2024)   Social Connection and Isolation Panel    Frequency of Communication with Friends and Family: Three times a week    Frequency of Social Gatherings with Friends and Family: More than three times a week    Attends Religious Services: Never    Database administrator or Organizations: No    Attends Engineer, structural: Not on file    Marital Status: Married  Catering manager Violence: Not At Risk (10/27/2023)   Humiliation, Afraid, Rape, and Kick questionnaire    Fear of Current or Ex-Partner: No    Emotionally Abused: No    Physically Abused: No    Sexually Abused: No    Outpatient Medications Prior to Visit  Medication Sig Dispense Refill   aspirin EC 81 MG tablet Take 81 mg by mouth daily.     Calcium  Carb-Cholecalciferol (CALCIUM-VITAMIN D3 PO) Take 1 tablet by mouth at bedtime.     Iron , Ferrous Sulfate , 325 (65 Fe) MG TABS Take 325 mg by mouth daily.     magnesium  oxide (MAG-OX) 400 MG tablet Take 1 tablet (400 mg total) by mouth daily. 120 tablet 1  omeprazole  (PRILOSEC) 40 MG capsule Take 1 capsule (40 mg total) by mouth 2 (two) times daily. 180 capsule 0   potassium chloride  (KLOR-CON  M10) 10 MEQ tablet Take 1 tablet (10 mEq total) by mouth 2 (two) times daily. 180 tablet 1   pravastatin  (PRAVACHOL ) 40 MG tablet Take 1 tablet (40 mg total) by mouth daily. 90 tablet 1   SYNJARDY  XR 25-1000 MG TB24 Take 1 tablet by mouth daily. 90 tablet 1   valsartan -hydrochlorothiazide  (DIOVAN -HCT) 160-25 MG tablet Take 1 tablet by mouth every morning. 90 tablet 1   Cholecalciferol (VITAMIN D3) 25 MCG (1000 UT) CAPS Take 1 capsule (1,000 Units total) by mouth daily. 60 capsule    No facility-administered medications prior to visit.    Allergies  Allergen Reactions   Carbamazepine Hives   Dilantin [Phenytoin Sodium Extended] Hives and Swelling   Lamictal [Lamotrigine]     Severe skin rash    ROS See HPI    Objective:    Physical Exam Constitutional:      General: She is not in acute distress.    Appearance: Normal appearance. She is well-developed.  HENT:     Head: Normocephalic and atraumatic.     Right Ear: External ear normal.     Left Ear: External ear normal.   Eyes:     General: No scleral icterus.  Neck:     Thyroid : No thyromegaly.   Cardiovascular:     Rate and Rhythm: Normal rate and regular rhythm.     Heart sounds: Normal heart sounds. No murmur heard. Pulmonary:     Effort: Pulmonary effort is normal. No respiratory distress.     Breath sounds: Normal breath sounds. No wheezing.   Musculoskeletal:     Cervical back: Neck supple.   Skin:    General: Skin is warm and dry.   Neurological:     Mental Status: She is alert and oriented to person, place, and time.    Psychiatric:        Mood and Affect: Mood normal.        Behavior: Behavior normal.        Thought Content: Thought content normal.        Judgment: Judgment normal.      BP 125/69 (BP Location: Right Arm, Patient Position: Sitting, Cuff Size: Normal)   Pulse 83   Temp 98.6 F (37 C) (Oral)   Resp 16   Ht 5' 4 (1.626 m)   Wt 192 lb (87.1 kg)   SpO2 96%   BMI 32.96 kg/m  Wt Readings from Last 3 Encounters:  05/14/24 192 lb (87.1 kg)  01/14/24 191 lb (86.6 kg)  10/21/23 197 lb (89.4 kg)       Assessment & Plan:   Problem List Items Addressed This Visit       Unprioritized   Vitamin D  deficiency - Primary   Relevant Orders   VITAMIN D  25 Hydroxy (Vit-D Deficiency, Fractures)   Trigeminal neuralgia   Stable following surgery.        Osteopenia   Not meeting frax threshold. Continue caltrate, weight bearing exercise.      OSA (obstructive sleep apnea)   Will request sleep study results to forward to orthodontist (Dr. Micky).       Hypertension   Stable on valsartan  HCT.      Hyperlipidemia   Lab Results  Component Value Date   CHOL 184 09/05/2023   HDL 78.50 09/05/2023   LDLCALC 90 09/05/2023  TRIG 80.0 09/05/2023   CHOLHDL 2 09/05/2023   Stable on pravastatin . Continue same.      Diabetes type 2, controlled (HCC)   Lab Results  Component Value Date   HGBA1C 6.5 01/14/2024   HGBA1C 6.4 09/05/2023   HGBA1C 6.6 (H) 12/04/2022   Lab Results  Component Value Date   MICROALBUR <0.7 01/14/2024   LDLCALC 90 09/05/2023   CREATININE 0.79 01/14/2024   At goal on synjardy  xr. Update A1c.      Relevant Orders   HgB A1c   Basic Metabolic Panel (BMET)   Urine Microalbumin w/creat. ratio   Bilateral carpal tunnel syndrome   Symptoms unchanged.  Declines surgical referral.       Barrett's esophagus   Following with GI for annual endo, continues prilosec bid.       I am having Mela Balch [name] maintain her Vitamin D3, aspirin EC,  Synjardy  XR, pravastatin , valsartan -hydrochlorothiazide , potassium chloride , magnesium  oxide, omeprazole , Iron  (Ferrous Sulfate ), and Calcium Carb-Cholecalciferol (CALCIUM-VITAMIN D3 PO).  No orders of the defined types were placed in this encounter.

## 2024-05-14 NOTE — Telephone Encounter (Signed)
 Can you please request copy of sleep study from [facility] 2018?

## 2024-05-14 NOTE — Assessment & Plan Note (Signed)
Stable following surgery.

## 2024-05-14 NOTE — Assessment & Plan Note (Signed)
 Lab Results  Component Value Date   HGBA1C 6.5 01/14/2024   HGBA1C 6.4 09/05/2023   HGBA1C 6.6 (H) 12/04/2022   Lab Results  Component Value Date   MICROALBUR <0.7 01/14/2024   LDLCALC 90 09/05/2023   CREATININE 0.79 01/14/2024   At goal on synjardy  xr. Update A1c.

## 2024-05-14 NOTE — Telephone Encounter (Signed)
 Release faxed to [facility] neuro to get report

## 2024-05-14 NOTE — Patient Instructions (Signed)
 VISIT SUMMARY:  You had a routine follow-up visit today. Your trigeminal neuralgia remains stable post-surgery, and your diabetes, blood pressure, and cholesterol are well-managed. We discussed your ongoing management for osteoporosis, GERD, carpal tunnel syndrome, and iron  deficiency anemia.  YOUR PLAN:  TRIGEMINAL NEURALGIA: Your post-surgical management is effective, but you are experiencing drooling likely due to residual numbness. -Consult a neurologist regarding drooling and numbness.  TYPE 2 DIABETES MELLITUS: Your diabetes is well-managed with an A1c of 6.5%. -Continue taking Synjardy  XR. -We will order A1c and urine tests for protein.  HYPERTENSION: Your blood pressure is well-controlled at 125/69 mmHg. -Continue taking valsartan  HCT.  HYPERLIPIDEMIA: Your cholesterol levels are well-controlled. -Continue taking pravastatin .  GASTROESOPHAGEAL REFLUX DISEASE (GERD) AND BARRETT'S ESOPHAGUS: You are on omeprazole  and had an endoscopy last January. -Continue taking omeprazole . -Schedule a follow-up endoscopy in one year.  CARPAL TUNNEL SYNDROME: Your symptoms are managed with activity modification and exercises. -Consider seeing a specialist if your condition worsens.  OSTEOPENIA: Your bone density is below the threshold for bisphosphonate treatment. -Continue taking calcium and vitamin D  supplements. -Engage in regular weight-bearing exercise.  IRON  DEFICIENCY ANEMIA: You are using over-the-counter iron  supplements due to insurance changes. -We will monitor your iron  levels and adjust supplementation as needed.  GENERAL HEALTH MAINTENANCE: We need to check your vitamin D  levels and you are due for a tetanus vaccine. -We will order a vitamin D  test. -Get a tetanus vaccine at the pharmacy.  FOLLOW-UP: Routine follow-up and records release needed for orthodontist. -Schedule a follow-up appointment in three months. -We will facilitate the records release for your orthodontist  regarding sleep apnea results.

## 2024-05-14 NOTE — Assessment & Plan Note (Signed)
 Not meeting frax threshold. Continue caltrate, weight bearing exercise.

## 2024-05-14 NOTE — Assessment & Plan Note (Signed)
 Stable on valsartan  HCT.

## 2024-05-14 NOTE — Assessment & Plan Note (Signed)
 Will request sleep study results to forward to orthodontist (Dr. Micky).

## 2024-05-14 NOTE — Assessment & Plan Note (Signed)
 Lab Results  Component Value Date   WBC 5.3 01/14/2024   HGB 13.8 01/14/2024   HCT 42.1 01/14/2024   MCV 84.3 01/14/2024   PLT 265.0 01/14/2024   Stable on iron  325mg  daily. Continue same.

## 2024-05-14 NOTE — Assessment & Plan Note (Signed)
 Following with GI for annual endo, continues prilosec bid.

## 2024-05-16 ENCOUNTER — Ambulatory Visit: Payer: Self-pay | Admitting: Family

## 2024-05-16 DIAGNOSIS — E559 Vitamin D deficiency, unspecified: Secondary | ICD-10-CM

## 2024-05-16 MED ORDER — VITAMIN D (ERGOCALCIFEROL) 1.25 MG (50000 UNIT) PO CAPS
50000.0000 [IU] | ORAL_CAPSULE | ORAL | 0 refills | Status: DC
  Filled 2024-05-16: qty 12, 84d supply, fill #0

## 2024-05-16 NOTE — Telephone Encounter (Signed)
Vitamin D level is low.  Advise patient to begin vit D 50000 units once weekly for 12 weeks, then repeat vit D level (dx Vit D deficiency).     

## 2024-05-17 ENCOUNTER — Other Ambulatory Visit (HOSPITAL_BASED_OUTPATIENT_CLINIC_OR_DEPARTMENT_OTHER): Payer: Self-pay

## 2024-05-18 ENCOUNTER — Other Ambulatory Visit: Payer: Self-pay

## 2024-05-18 NOTE — Telephone Encounter (Signed)
 Patient notified of results and prescription. She will follow up in 3 months as scheduled

## 2024-06-14 ENCOUNTER — Other Ambulatory Visit: Payer: Self-pay

## 2024-06-14 ENCOUNTER — Other Ambulatory Visit (HOSPITAL_COMMUNITY): Payer: Self-pay

## 2024-08-18 ENCOUNTER — Other Ambulatory Visit (HOSPITAL_BASED_OUTPATIENT_CLINIC_OR_DEPARTMENT_OTHER): Payer: Self-pay

## 2024-08-18 ENCOUNTER — Ambulatory Visit: Admitting: Family

## 2024-08-18 ENCOUNTER — Other Ambulatory Visit: Payer: Self-pay

## 2024-08-18 VITALS — BP 126/79 | HR 80 | Temp 78.0°F | Resp 16 | Ht 64.0 in | Wt 195.0 lb

## 2024-08-18 DIAGNOSIS — L509 Urticaria, unspecified: Secondary | ICD-10-CM

## 2024-08-18 DIAGNOSIS — E119 Type 2 diabetes mellitus without complications: Secondary | ICD-10-CM | POA: Diagnosis not present

## 2024-08-18 DIAGNOSIS — Z23 Encounter for immunization: Secondary | ICD-10-CM | POA: Diagnosis not present

## 2024-08-18 DIAGNOSIS — D649 Anemia, unspecified: Secondary | ICD-10-CM

## 2024-08-18 DIAGNOSIS — G4733 Obstructive sleep apnea (adult) (pediatric): Secondary | ICD-10-CM

## 2024-08-18 DIAGNOSIS — E559 Vitamin D deficiency, unspecified: Secondary | ICD-10-CM

## 2024-08-18 DIAGNOSIS — G500 Trigeminal neuralgia: Secondary | ICD-10-CM

## 2024-08-18 DIAGNOSIS — E785 Hyperlipidemia, unspecified: Secondary | ICD-10-CM | POA: Diagnosis not present

## 2024-08-18 DIAGNOSIS — E876 Hypokalemia: Secondary | ICD-10-CM

## 2024-08-18 DIAGNOSIS — I10 Essential (primary) hypertension: Secondary | ICD-10-CM

## 2024-08-18 DIAGNOSIS — M8580 Other specified disorders of bone density and structure, unspecified site: Secondary | ICD-10-CM

## 2024-08-18 DIAGNOSIS — K2270 Barrett's esophagus without dysplasia: Secondary | ICD-10-CM

## 2024-08-18 LAB — CBC WITH DIFFERENTIAL/PLATELET
Basophils Absolute: 0.1 K/uL (ref 0.0–0.1)
Basophils Relative: 1.1 % (ref 0.0–3.0)
Eosinophils Absolute: 0.1 K/uL (ref 0.0–0.7)
Eosinophils Relative: 1.9 % (ref 0.0–5.0)
HCT: 43.3 % (ref 36.0–46.0)
Hemoglobin: 14.3 g/dL (ref 12.0–15.0)
Lymphocytes Relative: 25.7 % (ref 12.0–46.0)
Lymphs Abs: 1.5 K/uL (ref 0.7–4.0)
MCHC: 33.1 g/dL (ref 30.0–36.0)
MCV: 85.7 fl (ref 78.0–100.0)
Monocytes Absolute: 0.4 K/uL (ref 0.1–1.0)
Monocytes Relative: 7.6 % (ref 3.0–12.0)
Neutro Abs: 3.7 K/uL (ref 1.4–7.7)
Neutrophils Relative %: 63.7 % (ref 43.0–77.0)
Platelets: 233 K/uL (ref 150.0–400.0)
RBC: 5.06 Mil/uL (ref 3.87–5.11)
RDW: 14.6 % (ref 11.5–15.5)
WBC: 5.8 K/uL (ref 4.0–10.5)

## 2024-08-18 LAB — BASIC METABOLIC PANEL WITH GFR
BUN: 17 mg/dL (ref 6–23)
CO2: 29 meq/L (ref 19–32)
Calcium: 9.6 mg/dL (ref 8.4–10.5)
Chloride: 100 meq/L (ref 96–112)
Creatinine, Ser: 0.85 mg/dL (ref 0.40–1.20)
GFR: 69.36 mL/min (ref >60.00)
Glucose, Bld: 105 mg/dL — ABNORMAL HIGH (ref 70–99)
Potassium: 3.8 meq/L (ref 3.5–5.1)
Sodium: 140 meq/L (ref 135–145)

## 2024-08-18 LAB — LIPID PANEL
Cholesterol: 191 mg/dL (ref 0–200)
HDL: 76.8 mg/dL (ref >39.00)
LDL Cholesterol: 98 mg/dL (ref 0–99)
NonHDL: 114.22
Total CHOL/HDL Ratio: 2
Triglycerides: 82 mg/dL (ref 0.0–149.0)
VLDL: 16.4 mg/dL (ref 0.0–40.0)

## 2024-08-18 LAB — IBC PANEL
Iron: 107 ug/dL (ref 42–145)
Saturation Ratios: 26.3 % (ref 20.0–50.0)
TIBC: 407.4 ug/dL (ref 250.0–450.0)
Transferrin: 291 mg/dL (ref 212.0–360.0)

## 2024-08-18 LAB — VITAMIN D 25 HYDROXY (VIT D DEFICIENCY, FRACTURES): VITD: 34.03 ng/mL (ref 30.00–100.00)

## 2024-08-18 LAB — HEMOGLOBIN A1C: Hgb A1c MFr Bld: 6.3 % (ref 4.6–6.5)

## 2024-08-18 MED ORDER — VALSARTAN-HYDROCHLOROTHIAZIDE 160-25 MG PO TABS
1.0000 | ORAL_TABLET | Freq: Every morning | ORAL | 1 refills | Status: AC
  Filled 2024-08-18: qty 90, 90d supply, fill #0
  Filled 2024-11-23 – 2024-12-07 (×2): qty 90, 90d supply, fill #1

## 2024-08-18 MED ORDER — CETIRIZINE HCL 10 MG PO TABS: 10.0000 mg | ORAL_TABLET | Freq: Every day | ORAL | Status: AC | PRN

## 2024-08-18 MED ORDER — SYNJARDY XR 25-1000 MG PO TB24
1.0000 | ORAL_TABLET | Freq: Every day | ORAL | 1 refills | Status: DC
  Filled 2024-08-18: qty 90, 90d supply, fill #0
  Filled 2024-11-24: qty 30, 30d supply, fill #1

## 2024-08-18 MED ORDER — OMEPRAZOLE 40 MG PO CPDR
40.0000 mg | DELAYED_RELEASE_CAPSULE | Freq: Two times a day (BID) | ORAL | 1 refills | Status: AC
  Filled 2024-08-18: qty 180, 90d supply, fill #0
  Filled 2024-11-13: qty 180, 90d supply, fill #1

## 2024-08-18 MED ORDER — POTASSIUM CHLORIDE CRYS ER 10 MEQ PO TBCR
10.0000 meq | EXTENDED_RELEASE_TABLET | Freq: Two times a day (BID) | ORAL | 1 refills | Status: AC
  Filled 2024-08-18: qty 180, 90d supply, fill #0
  Filled 2024-11-23 – 2024-12-07 (×2): qty 180, 90d supply, fill #1

## 2024-08-18 MED ORDER — PRAVASTATIN SODIUM 40 MG PO TABS
40.0000 mg | ORAL_TABLET | Freq: Every day | ORAL | 1 refills | Status: AC
  Filled 2024-08-18: qty 90, 90d supply, fill #0
  Filled 2024-11-23 – 2024-12-07 (×2): qty 90, 90d supply, fill #1

## 2024-08-18 NOTE — Assessment & Plan Note (Signed)
 Maintained on caltrate.

## 2024-08-18 NOTE — Assessment & Plan Note (Signed)
 Lab Results  Component Value Date   HGBA1C 6.4 05/14/2024   HGBA1C 6.5 01/14/2024   HGBA1C 6.4 09/05/2023   Lab Results  Component Value Date   MICROALBUR <0.7 05/14/2024   LDLCALC 90 09/05/2023   CREATININE 0.83 05/14/2024   Maintained on synjardy  XR, update A1C.

## 2024-08-18 NOTE — Assessment & Plan Note (Signed)
 Uses zyrtec prn.

## 2024-08-18 NOTE — Assessment & Plan Note (Signed)
Maintained on Kdur.

## 2024-08-18 NOTE — Assessment & Plan Note (Signed)
 Has some facial numbness but no pain.  Monitor.

## 2024-08-18 NOTE — Progress Notes (Signed)
 Subjective:     Patient ID: Andrea Melton, female    DOB: 1956-06-06, 68 y.o.   MRN: [id number]  Chief Complaint  Patient presents with   Hypertension    Here for follow up   Vitamin D  deficiency    Here for follow up, vitamin D  check    Hypertension    Discussed the use of AI scribe software for clinical note transcription with the patient, who gave verbal consent to proceed.  History of Present Illness   Andrea Melton is a 68 year old female who presents for a routine follow-up visit.  She manages seasonal hives with cetirizine as needed, which effectively alleviates itching. Trigeminal neuralgia pain is well-controlled, though numbness persists. She has not had recent kidney stones, despite a family history, and takes a calcium supplement regularly.  Sleep apnea symptoms have improved, with no significant daytime tiredness or snoring. She occasionally wakes due to her cat. Cholesterol management includes pravastatin , with her last check a year ago.  Glucose is managed with Synjardy  XR, with a last A1c of 6.4. She anticipates a possible increase due to increased coffee consumption. Barrett's esophagus is managed with omeprazole  twice daily, with gastroenterology follow-up scheduled.  She takes a potassium supplement, completed a 12-week course of vitamin D , and also takes magnesium  oxide and an over-the-counter iron  supplement.    Health Maintenance Due  Topic Date Due   COVID-19 Vaccine (3 - Moderna risk series) 03/13/2020   Influenza Vaccine  06/18/2024    Past Medical History:  Diagnosis Date   Acute renal failure (ARF)    Allergy    Anemia    Bilateral carpal tunnel syndrome    Colon polyp    Diabetes type 2, controlled (HCC) 12/04/2022   GERD (gastroesophageal reflux disease)    Hydronephrosis of right kidney    Hyperlipidemia    Hyperparathyroidism    Hypertension    Kidney stone    OSA (obstructive sleep apnea)    Osteoarthritis     Osteopenia    Parathyroid  adenoma    s/p excision   Sepsis due to Gram negative bacteria (HCC) 09/01/2015   Trigeminal neuralgia    Urticaria    Vitamin D  deficiency     Past Surgical History:  Procedure Laterality Date   APPENDECTOMY  2020   CESAREAN SECTION  1980 1989   craniotomy microvascular decompression  10/24/2023   [facility], for trigeminal neuralgia   FACIAL NERVE SURGERY  11/18/1998   [facility]   PARATHYROIDECTOMY     2022   right total hip Right 2019    Family History  Problem Relation Age of Onset   Hypertension Mother    Stroke Mother    Heart disease Father    Hypertension Father    Stroke Father    Cancer Sister        Breast/Uterine    Hypertension Sister    Kidney disease Sister        Kidney Stone    Cancer Sister        Breast    Hypertension Sister    Pancreatic cancer Sister    Hypertension Brother    Kidney disease Brother        Kidney Stones    Depression Maternal Grandmother    Breast cancer Daughter 60    Social History   Socioeconomic History   Marital status: Married    Spouse name: Dempsey    Number of children: 3   Years of education:  16   Highest education level: Bachelor's degree (e.g., BA, AB, BS)  Occupational History   Occupation: ASSISTANT DIRECTOR     Employer: OTHER    Comment: [employer]   Tobacco Use   Smoking status: Former    Current packs/day: 0.00    Average packs/day: 0.3 packs/day for 5.0 years (1.3 ttl pk-yrs)    Types: Cigarettes    Start date: 11/19/1963    Quit date: 11/18/1968    Years since quitting: 55.7   Smokeless tobacco: Never  Substance and Sexual Activity   Alcohol use: Yes    Alcohol/week: 2.0 standard drinks of alcohol    Types: 2 Glasses of wine per week    Comment: Twice Weekly    Drug use: No   Sexual activity: Yes    Partners: Male  Other Topics Concern   Not on file  Social History Narrative   Marital Status:  Married [name])    Children:  Daughters (3)    Pets: Cats (3)    Living  Situation: Lives with  husband and 2 daughters   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    Alcohol Use:  Twice weekly (2 glasses of wine)    Drug Use:  None   Diet:  RegularExercise:  Cardio and weights three times weekly   Hobbies:  Sewing, Reading and gardening       Social Drivers of Health   Financial Resource Strain: Low Risk  (05/13/2024)   Overall Financial Resource Strain (CARDIA)    Difficulty of Paying Living Expenses: Not very hard  Food Insecurity: No Food Insecurity (05/13/2024)   Hunger Vital Sign    Worried About Running Out of Food in the Last Year: Never true    Ran Out of Food in the Last Year: Never true  Transportation Needs: No Transportation Needs (05/13/2024)   PRAPARE - Administrator, Civil Service (Medical): No    Lack of Transportation (Non-Medical): No  Physical Activity: Inactive (05/13/2024)   Exercise Vital Sign    Days of Exercise per Week: 0 days    Minutes of Exercise per Session: Not on file  Stress: Stress Concern Present (05/13/2024)   [employer] of Occupational Health - Occupational Stress Questionnaire    Feeling of Stress: To some extent  Social Connections: Moderately Isolated (05/13/2024)   Social Connection and Isolation Panel    Frequency of Communication with Friends and Family: Three times a week    Frequency of Social Gatherings with Friends and Family: More than three times a week    Attends Religious Services: Never    Database administrator or Organizations: No    Attends Engineer, structural: Not on file    Marital Status: Married  Catering manager Violence: Not At Risk (10/27/2023)   Humiliation, Afraid, Rape, and Kick questionnaire    Fear of Current or Ex-Partner: No    Emotionally Abused: No    Physically Abused: No     Sexually Abused: No    Outpatient Medications Prior to Visit  Medication Sig Dispense Refill   aspirin EC 81 MG tablet Take 81 mg by mouth daily.     Calcium Carb-Cholecalciferol (CALCIUM-VITAMIN D3 PO) Take 1 tablet by mouth at bedtime.     Cholecalciferol (VITAMIN D3) 25 MCG (1000 UT)  CAPS Take 1 capsule (1,000 Units total) by mouth daily. 60 capsule    Iron , Ferrous Sulfate , 325 (65 Fe) MG TABS Take 325 mg by mouth daily.     magnesium  oxide (MAG-OX) 400 MG tablet Take 1 tablet (400 mg total) by mouth daily. 120 tablet 1   omeprazole  (PRILOSEC) 40 MG capsule Take 1 capsule (40 mg total) by mouth 2 (two) times daily. 180 capsule 0   potassium chloride  (KLOR-CON  M10) 10 MEQ tablet Take 1 tablet (10 mEq total) by mouth 2 (two) times daily. 180 tablet 1   pravastatin  (PRAVACHOL ) 40 MG tablet Take 1 tablet (40 mg total) by mouth daily. 90 tablet 1   SYNJARDY  XR 25-1000 MG TB24 Take 1 tablet by mouth daily. 90 tablet 1   valsartan -hydrochlorothiazide  (DIOVAN -HCT) 160-25 MG tablet Take 1 tablet by mouth every morning. 90 tablet 1   Vitamin D , Ergocalciferol , (DRISDOL ) 1.25 MG (50000 UNIT) CAPS capsule Take 1 capsule (50,000 Units total) by mouth every 7 (seven) days. 12 capsule 0   No facility-administered medications prior to visit.    Allergies  Allergen Reactions   Carbamazepine Hives   Dilantin [Phenytoin Sodium Extended] Hives and Swelling   Lamictal [Lamotrigine]     Severe skin rash    ROS    See HPI Objective:    Physical Exam Constitutional:      General: She is not in acute distress.    Appearance: Normal appearance. She is well-developed.  HENT:     Head: Normocephalic and atraumatic.     Right Ear: External ear normal.     Left Ear: External ear normal.  Eyes:     General: No scleral icterus. Neck:     Thyroid : No thyromegaly.  Cardiovascular:     Rate and Rhythm: Normal rate and regular rhythm.     Heart sounds: Normal heart sounds. No murmur heard. Pulmonary:      Effort: Pulmonary effort is normal. No respiratory distress.     Breath sounds: Normal breath sounds. No wheezing.  Musculoskeletal:     Cervical back: Neck supple.  Skin:    General: Skin is warm and dry.  Neurological:     Mental Status: She is alert and oriented to person, place, and time.  Psychiatric:        Mood and Affect: Mood normal.        Behavior: Behavior normal.        Thought Content: Thought content normal.        Judgment: Judgment normal.      BP 126/79 (BP Location: Right Arm, Patient Position: Sitting, Cuff Size: Large)   Pulse 80   Temp (!) 78 F (25.6 C) (Oral)   Resp 16   Ht 5' 4 (1.626 m)   Wt 195 lb (88.5 kg)   SpO2 97%   BMI 33.47 kg/m  Wt Readings from Last 3 Encounters:  08/18/24 195 lb (88.5 kg)  05/14/24 192 lb (87.1 kg)  01/14/24 191 lb (86.6 kg)       Assessment & Plan:   Problem List Items Addressed This Visit       Unprioritized   Vitamin D  deficiency   Completed 12 weeks high dose vit D. Update level.       Relevant Orders   Vitamin D  (25 hydroxy)   Urticaria - Primary   Uses zyrtec prn.       Relevant Medications   cetirizine (ZYRTEC) 10 MG tablet   Trigeminal neuralgia  Has some facial numbness but no pain.  Monitor.       Osteopenia   Maintained on caltrate.       OSA (obstructive sleep apnea)   Declines treatment.  It does not appear that her insurance covers Zepbound.       Hypokalemia   Maintained on Kdur.       Relevant Medications   potassium chloride  (KLOR-CON  M10) 10 MEQ tablet   Hypertension   BP Readings from Last 3 Encounters:  08/18/24 126/79  05/14/24 125/69  01/14/24 133/82   At goal, continue diavan HCT.      Relevant Medications   pravastatin  (PRAVACHOL ) 40 MG tablet   valsartan -hydrochlorothiazide  (DIOVAN -HCT) 160-25 MG tablet   Hyperlipidemia   Lab Results  Component Value Date   CHOL 184 09/05/2023   HDL 78.50 09/05/2023   LDLCALC 90 09/05/2023   TRIG 80.0 09/05/2023    CHOLHDL 2 09/05/2023   Maintained on pravastatin . Update lipids.      Relevant Medications   pravastatin  (PRAVACHOL ) 40 MG tablet   valsartan -hydrochlorothiazide  (DIOVAN -HCT) 160-25 MG tablet   Other Relevant Orders   Lipid panel   Controlled type 2 diabetes mellitus without complication, without long-term current use of insulin  [facility])   Lab Results  Component Value Date   HGBA1C 6.4 05/14/2024   HGBA1C 6.5 01/14/2024   HGBA1C 6.4 09/05/2023   Lab Results  Component Value Date   MICROALBUR <0.7 05/14/2024   LDLCALC 90 09/05/2023   CREATININE 0.83 05/14/2024   Maintained on synjardy  XR, update A1C.      Relevant Medications   SYNJARDY  XR 25-1000 MG TB24   pravastatin  (PRAVACHOL ) 40 MG tablet   valsartan -hydrochlorothiazide  (DIOVAN -HCT) 160-25 MG tablet   Barrett's esophagus   She is followed by GI,  Dr. Lucillie at [facility].  Continues omeprazole  40mg  bid.      Relevant Medications   omeprazole  (PRILOSEC) 40 MG capsule   Anemia   Lab Results  Component Value Date   WBC 5.3 01/14/2024   HGB 13.8 01/14/2024   HCT 42.1 01/14/2024   MCV 84.3 01/14/2024   PLT 265.0 01/14/2024         Relevant Orders   CBC with Differential/Platelet   IBC panel   Other Visit Diagnoses       Needs flu shot       Relevant Orders   Flu vaccine HIGH DOSE PF(Fluzone Trivalent)       I have discontinued Betzaida Ashe [name]'s Vitamin D  (Ergocalciferol ). I am also having her start on cetirizine. Additionally, I am having her maintain her Vitamin D3, aspirin EC, magnesium  oxide, Iron  (Ferrous Sulfate ), Calcium Carb-Cholecalciferol (CALCIUM-VITAMIN D3 PO), Synjardy  XR, pravastatin , valsartan -hydrochlorothiazide , potassium chloride , and omeprazole .  Meds ordered this encounter  Medications   cetirizine (ZYRTEC) 10 MG tablet    Sig: Take 1 tablet (10 mg total) by mouth daily as needed for allergies.    Supervising Provider:   DOMENICA BLACKBIRD A [[id number]]   SYNJARDY  XR 25-1000 MG TB24     Sig: Take 1 tablet by mouth daily.    Dispense:  90 tablet    Refill:  1    Supervising Provider:   DOMENICA BLACKBIRD A [[id number]]   pravastatin  (PRAVACHOL ) 40 MG tablet    Sig: Take 1 tablet (40 mg total) by mouth daily.    Dispense:  90 tablet    Refill:  1    Supervising Provider:   DOMENICA BLACKBIRD A [[id number]]   valsartan -hydrochlorothiazide  (DIOVAN -HCT) 160-25 MG  tablet    Sig: Take 1 tablet by mouth every morning.    Dispense:  90 tablet    Refill:  1    Supervising Provider:   DOMENICA BLACKBIRD A [[id number]]   potassium chloride  (KLOR-CON  M10) 10 MEQ tablet    Sig: Take 1 tablet (10 mEq total) by mouth 2 (two) times daily.    Dispense:  180 tablet    Refill:  1    Supervising Provider:   DOMENICA BLACKBIRD A [[id number]]   omeprazole  (PRILOSEC) 40 MG capsule    Sig: Take 1 capsule (40 mg total) by mouth 2 (two) times daily.    Dispense:  180 capsule    Refill:  1    Supervising Provider:   DOMENICA BLACKBIRD A [[id number]]

## 2024-08-18 NOTE — Assessment & Plan Note (Signed)
 She is followed by GI,  Dr. Lucillie at [facility].  Continues omeprazole  40mg  bid.

## 2024-08-18 NOTE — Patient Instructions (Signed)
 VISIT SUMMARY:  During your routine follow-up visit, we reviewed your management plans for various conditions including diabetes, hypertension, high cholesterol, chronic hives, trigeminal neuralgia, Barrett's esophagus, chronic kidney disease, vitamin D  deficiency, iron  deficiency, and osteopenia. We also discussed general health maintenance, including exercise and flu vaccination.  YOUR PLAN:  TYPE 2 DIABETES MELLITUS: Your diabetes is managed with Synjardy  XR, and your last A1c was 6.4%. -Continue taking Synjardy  XR as prescribed.  ESSENTIAL HYPERTENSION: Your blood pressure is well-controlled at 126/79 mmHg with Diovan  HCT. -Continue taking Diovan  HCT as prescribed.  HYPERLIPIDEMIA: Your cholesterol is managed with pravastatin , and your last cholesterol check was a year ago. -Continue taking pravastatin  as prescribed. -We will order a cholesterol panel to check your levels.  CHRONIC URTICARIA: You manage your seasonal hives with cetirizine as needed. -Continue taking cetirizine as needed.  TRIGEMINAL NEURALGIA WITH RESIDUAL NUMBNESS: Your pain is controlled, but you still have some numbness. -No changes to your current management plan.  BARRETT'S ESOPHAGUS: Your condition is managed with omeprazole , and you have a follow-up with gastroenterology scheduled. -Continue taking omeprazole  twice daily. -Follow up with gastroenterology in December.  CHRONIC KIDNEY DISEASE, UNSPECIFIED STAGE: You have chronic kidney disease, but the stage is unspecified. -No changes to your current management plan.  VITAMIN D  DEFICIENCY: You completed a 12-week course of high-dose vitamin D  and are awaiting lab results. -We will order a vitamin D  level test. -If your vitamin D  level is normal, switch to over-the-counter vitamin D  3,000 units daily.  IRON  DEFICIENCY: You are managing your iron  deficiency with an over-the-counter supplement. -Continue taking the over-the-counter iron   supplement.  OSTEOPENIA: Your bone density was checked earlier this year, and you are taking calcium supplements. -Continue taking calcium supplements. -Repeat bone density scan in two years.  GENERAL HEALTH MAINTENANCE: We discussed exercise and flu vaccination. -You received a flu shot today. -Encourage walking 3,000 to 5,000 steps daily.

## 2024-08-18 NOTE — Assessment & Plan Note (Signed)
 Completed 12 weeks high dose vit D. Update level.

## 2024-08-18 NOTE — Assessment & Plan Note (Signed)
 Lab Results  Component Value Date   WBC 5.3 01/14/2024   HGB 13.8 01/14/2024   HCT 42.1 01/14/2024   MCV 84.3 01/14/2024   PLT 265.0 01/14/2024

## 2024-08-18 NOTE — Assessment & Plan Note (Signed)
 BP Readings from Last 3 Encounters:  08/18/24 126/79  05/14/24 125/69  01/14/24 133/82   At goal, continue diavan HCT.

## 2024-08-18 NOTE — Assessment & Plan Note (Signed)
 Lab Results  Component Value Date   CHOL 184 09/05/2023   HDL 78.50 09/05/2023   LDLCALC 90 09/05/2023   TRIG 80.0 09/05/2023   CHOLHDL 2 09/05/2023   Maintained on pravastatin . Update lipids.

## 2024-08-18 NOTE — Assessment & Plan Note (Addendum)
 Declines treatment.  It does not appear that her insurance covers Zepbound.

## 2024-08-19 ENCOUNTER — Ambulatory Visit: Payer: Self-pay | Admitting: Family

## 2024-08-19 LAB — MICROALBUMIN / CREATININE URINE RATIO
Creatinine,U: 78.2 mg/dL
Microalb Creat Ratio: 10.8 mg/g (ref 0.0–30.0)
Microalb, Ur: 0.8 mg/dL (ref 0.0–1.9)

## 2024-08-26 ENCOUNTER — Other Ambulatory Visit (HOSPITAL_BASED_OUTPATIENT_CLINIC_OR_DEPARTMENT_OTHER): Payer: Self-pay

## 2024-08-26 ENCOUNTER — Ambulatory Visit: Admitting: Medical

## 2024-08-26 VITALS — BP 140/80 | HR 99 | Temp 98.5°F | Resp 16 | Ht 64.0 in | Wt 196.2 lb

## 2024-08-26 DIAGNOSIS — U071 COVID-19: Secondary | ICD-10-CM

## 2024-08-26 MED ORDER — BENZONATATE 100 MG PO CAPS
100.0000 mg | ORAL_CAPSULE | Freq: Three times a day (TID) | ORAL | 0 refills | Status: DC | PRN
  Filled 2024-08-26: qty 30, 10d supply, fill #0

## 2024-08-26 MED ORDER — NIRMATRELVIR/RITONAVIR (PAXLOVID)TABLET
3.0000 | ORAL_TABLET | Freq: Two times a day (BID) | ORAL | 0 refills | Status: AC
Start: 2024-08-26 — End: 2024-08-31
  Filled 2024-08-26: qty 30, 5d supply, fill #0

## 2024-08-26 MED ORDER — FLUTICASONE PROPIONATE 50 MCG/ACT NA SUSP
2.0000 | Freq: Every day | NASAL | 1 refills | Status: DC
Start: 2024-08-26 — End: 2024-09-13
  Filled 2024-08-26: qty 16, 30d supply, fill #0

## 2024-08-26 NOTE — Progress Notes (Signed)
 Subjective:    Patient ID: Andrea Melton, female    DOB: 31-Jul-1956, 68 y.o.   MRN: [id number]  HPI   Andrea Melton is a 68 year old female who presents with symptoms of COVID-19 after testing positive.  Symptoms began on Monday evening with a mild cough that worsened by the following day. She has since experienced a dry cough, congestion, mild myalgias, and fatigue. No fever is present. The cough is mostly dry, with occasional slight phlegm.  She has had COVID-19 twice before, with the first instance being particularly difficult. She has been vaccinated against COVID-19. No vaccine this year.  Her daughter had COVID-19 a couple of weeks ago. 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  She is currently taking a statin medication at night for cholesterol management. She does not smoke and denies having asthma or recent wheezing.      Review of Systems  Constitutional:  Positive for fatigue. Negative for chills.  HENT:  Positive for congestion.   Respiratory:  Positive for cough. Negative for shortness of breath and wheezing.   Cardiovascular:  Negative for chest pain and palpitations.  Gastrointestinal:  Negative for abdominal pain.  Genitourinary:  Negative for dysuria and flank pain.  Musculoskeletal:  Positive for myalgias. Negative for back pain.  Skin:  Negative for rash.  Neurological:  Negative for dizziness and numbness.  Hematological:  Negative for adenopathy. Does not bruise/bleed easily.  Psychiatric/Behavioral:  Negative for behavioral problems and confusion.     Past Medical History:  Diagnosis Date   Acute renal failure (ARF)    Allergy    Anemia    Bilateral carpal tunnel syndrome    Colon polyp    Diabetes type 2, controlled (HCC) 12/04/2022   GERD (gastroesophageal reflux disease)    Hydronephrosis of right kidney    Hyperlipidemia    Hyperparathyroidism    Hypertension    Kidney  stone    OSA (obstructive sleep apnea)    Osteoarthritis    Osteopenia    Parathyroid  adenoma    s/p excision   Sepsis due to Gram negative bacteria (HCC) 09/01/2015   Trigeminal neuralgia    Urticaria    Vitamin D  deficiency      Social History   Socioeconomic History   Marital status: Married    Spouse name: Dempsey    Number of children: 3   Years of education: 16   Highest education level: Bachelor's degree (e.g., BA, AB, BS)  Occupational History   Occupation: ASSISTANT DIRECTOR     Employer: OTHER    Comment: [employer]   Tobacco Use   Smoking status: Former    Current packs/day: 0.00    Average packs/day: 0.3 packs/day for 5.0 years (1.3 ttl pk-yrs)    Types: Cigarettes    Start date: 11/19/1963    Quit date: 11/18/1968    Years since quitting: 55.8   Smokeless tobacco: Never  Substance and Sexual Activity   Alcohol use: Yes    Alcohol/week: 2.0 standard drinks of alcohol    Types: 2 Glasses of wine per week    Comment: Twice Weekly    Drug use: No   Sexual activity: Yes    Partners: Male  Other Topics Concern   Not on file  Social History Narrative   Marital Status:  Married [name])    Children:  Daughters (3)    Pets: Cats (3)    Living  Situation: Lives with  husband and 2 daughters   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    Alcohol Use:  Twice weekly (2 glasses of wine)    Drug Use:  None   Diet:  RegularExercise:  Cardio and weights three times weekly   Hobbies:  Sewing, Reading and gardening       Social Drivers of Health   Financial Resource Strain: Low Risk  (05/13/2024)   Overall Financial Resource Strain (CARDIA)    Difficulty of Paying Living Expenses: Not very hard  Food Insecurity: No Food Insecurity (05/13/2024)   Hunger Vital Sign    Worried About Running Out of  Food in the Last Year: Never true    Ran Out of Food in the Last Year: Never true  Transportation Needs: No Transportation Needs (05/13/2024)   PRAPARE - Administrator, Civil Service (Medical): No    Lack of Transportation (Non-Medical): No  Physical Activity: Inactive (05/13/2024)   Exercise Vital Sign    Days of Exercise per Week: 0 days    Minutes of Exercise per Session: Not on file  Stress: Stress Concern Present (05/13/2024)   [employer] of Occupational Health - Occupational Stress Questionnaire    Feeling of Stress: To some extent  Social Connections: Moderately Isolated (05/13/2024)   Social Connection and Isolation Panel    Frequency of Communication with Friends and Family: Three times a week    Frequency of Social Gatherings with Friends and Family: More than three times a week    Attends Religious Services: Never    Database administrator or Organizations: No    Attends Engineer, structural: Not on file    Marital Status: Married  Catering manager Violence: Not At Risk (10/27/2023)   Humiliation, Afraid, Rape, and Kick questionnaire    Fear of Current or Ex-Partner: No    Emotionally Abused: No    Physically Abused: No    Sexually Abused: No    Past Surgical History:  Procedure Laterality Date   APPENDECTOMY  2020   CESAREAN SECTION  1980 1989   craniotomy microvascular decompression  10/24/2023   [facility], for trigeminal neuralgia   FACIAL NERVE SURGERY  11/18/1998   [facility]   PARATHYROIDECTOMY     2022   right total hip Right 2019    Family History  Problem Relation Age of Onset   Hypertension Mother    Stroke Mother    Heart disease Father    Hypertension Father    Stroke Father    Cancer Sister        Breast/Uterine    Hypertension Sister    Kidney disease Sister        Kidney Stone    Cancer Sister        Breast    Hypertension Sister    Pancreatic cancer Sister    Hypertension Brother    Kidney disease Brother         Kidney Stones    Depression Maternal Grandmother    Breast cancer Daughter 102    Allergies  Allergen Reactions   Carbamazepine Hives   Dilantin [Phenytoin Sodium Extended] Hives and Swelling   Lamictal [Lamotrigine]     Severe skin rash    Current Outpatient Medications on File Prior to Visit  Medication Sig Dispense Refill   aspirin EC 81 MG tablet Take 81 mg by mouth daily.     Calcium Carb-Cholecalciferol (CALCIUM-VITAMIN D3 PO) Take 1 tablet by mouth at bedtime.     cetirizine (ZYRTEC) 10 MG tablet Take 1 tablet (10 mg total) by mouth daily as needed for allergies.     Cholecalciferol (VITAMIN D3) 25 MCG (1000 UT) CAPS Take 1 capsule (1,000 Units total) by mouth daily. 60 capsule    Iron , Ferrous Sulfate , 325 (65 Fe) MG TABS Take 325 mg by mouth daily.     magnesium  oxide (MAG-OX) 400 MG tablet Take 1 tablet (400 mg total) by mouth daily. 120 tablet 1   omeprazole  (PRILOSEC) 40 MG capsule Take 1 capsule (40 mg total) by mouth 2 (two) times daily. 180 capsule 1   potassium chloride  (KLOR-CON  M10) 10 MEQ tablet Take 1 tablet (10 mEq total) by mouth 2 (two) times daily. 180 tablet 1   pravastatin  (PRAVACHOL ) 40 MG tablet Take 1 tablet (40 mg total) by mouth daily. 90 tablet 1   SYNJARDY  XR 25-1000 MG TB24 Take 1 tablet by mouth daily. 90 tablet 1   valsartan -hydrochlorothiazide  (DIOVAN -HCT) 160-25 MG tablet Take 1 tablet by mouth every morning. 90 tablet 1   No current facility-administered medications on file prior to visit.    BP (!) 140/80   Pulse 99   Temp 98.5 F (36.9 C) (Oral)   Resp 16   Ht 5' 4 (1.626 m)   Wt 196 lb 3.2 oz (89 kg)   SpO2 96%   BMI 33.68 kg/m        Objective:   Physical Exam   General Mental Status- Alert. General Appearance- Not in acute distress.   Skin General: Color- Normal Color. Moisture- Normal Moisture.  Neck No JVD.  Chest and Lung Exam Auscultation: Breath Sounds:-CTA  Cardiovascular Auscultation:Rythm- RRR Murmurs &  Other Heart Sounds:Auscultation of the heart reveals- No Murmurs.  Abdomen Inspection:-Inspeection Normal. Palpation/Percussion:Note:No mass. Palpation and Percussion of the abdomen reveal- Non Tender, Non Distended + BS, no rebound or guarding.   Neurologic Cranial Nerve exam:- CN III-XII intact(No nystagmus), symmetric smile. Strength:- 5/5 equal and symmetric strength both upper and lower extremities.    Lower ext- calfs symmetric, no pedal edema.    Assessment & Plan:   Patient Instructions  COVID-19 infection Acute COVID-19 infection with dry cough, congestion, mild body aches, and fatigue. Vaccinated but not boosted this year. Within the five-day window for antiviral treatment. Interaction noted between statin and Paxlovid. - Prescribe Paxlovid, ensuring a 24-hour gap from the last statin dose before starting and another 24-hour gap after completing Paxlovid before resuming statin. - Prescribe Flonase nasal spray for congestion. - Prescribe benzonatate  for cough. - Advise monitoring for secondary infections such as pneumonia or sinus infection. If symptoms worsen, consider azithromycin and chest x-ray. - Advise using an albuterol inhaler if wheezing develops. - Recommend working from home for five days from the positive test and wearing a mask for an additional five days upon returning to work.  Hyperlipidemia Interaction noted between statin and Paxlovid. - Ensure a 24-hour gap between the last statin dose and starting Paxlovid, and another 24-hour gap after completing Paxlovid before resuming statin.  Return to work timing discussed.  Follow up 7-10 days or sooner if needed

## 2024-08-26 NOTE — Patient Instructions (Signed)
 COVID-19 infection Acute COVID-19 infection with dry cough, congestion, mild body aches, and fatigue. Vaccinated but not boosted this year. Within the five-day window for antiviral treatment. Interaction noted between statin and Paxlovid. - Prescribe Paxlovid, ensuring a 24-hour gap from the last statin dose before starting and another 24-hour gap after completing Paxlovid before resuming statin. - Prescribe Flonase nasal spray for congestion. - Prescribe benzonatate  for cough. - Advise monitoring for secondary infections such as pneumonia or sinus infection. If symptoms worsen, consider azithromycin and chest x-ray. - Advise using an albuterol inhaler if wheezing develops. - Recommend working from home for five days from the positive test and wearing a mask for an additional five days upon returning to work.  Hyperlipidemia Interaction noted between statin and Paxlovid. - Ensure a 24-hour gap between the last statin dose and starting Paxlovid, and another 24-hour gap after completing Paxlovid before resuming statin.  Return to work timing discussed.  Follow up 7-10 days or sooner if needed

## 2024-09-13 ENCOUNTER — Other Ambulatory Visit (HOSPITAL_BASED_OUTPATIENT_CLINIC_OR_DEPARTMENT_OTHER): Payer: Self-pay

## 2024-09-13 ENCOUNTER — Encounter: Payer: Self-pay | Admitting: Physician Assistant

## 2024-09-13 ENCOUNTER — Ambulatory Visit: Admitting: Physician Assistant

## 2024-09-13 VITALS — BP 128/88 | HR 90

## 2024-09-13 DIAGNOSIS — C44622 Squamous cell carcinoma of skin of right upper limb, including shoulder: Secondary | ICD-10-CM

## 2024-09-13 DIAGNOSIS — L299 Pruritus, unspecified: Secondary | ICD-10-CM

## 2024-09-13 DIAGNOSIS — L814 Other melanin hyperpigmentation: Secondary | ICD-10-CM

## 2024-09-13 DIAGNOSIS — L821 Other seborrheic keratosis: Secondary | ICD-10-CM

## 2024-09-13 DIAGNOSIS — Z1283 Encounter for screening for malignant neoplasm of skin: Secondary | ICD-10-CM

## 2024-09-13 DIAGNOSIS — D1801 Hemangioma of skin and subcutaneous tissue: Secondary | ICD-10-CM

## 2024-09-13 DIAGNOSIS — L578 Other skin changes due to chronic exposure to nonionizing radiation: Secondary | ICD-10-CM | POA: Diagnosis not present

## 2024-09-13 DIAGNOSIS — D229 Melanocytic nevi, unspecified: Secondary | ICD-10-CM

## 2024-09-13 DIAGNOSIS — D489 Neoplasm of uncertain behavior, unspecified: Secondary | ICD-10-CM

## 2024-09-13 DIAGNOSIS — C4492 Squamous cell carcinoma of skin, unspecified: Secondary | ICD-10-CM

## 2024-09-13 DIAGNOSIS — W908XXA Exposure to other nonionizing radiation, initial encounter: Secondary | ICD-10-CM

## 2024-09-13 HISTORY — DX: Squamous cell carcinoma of skin, unspecified: C44.92

## 2024-09-13 MED ORDER — TRIAMCINOLONE ACETONIDE 0.1 % EX OINT
1.0000 | TOPICAL_OINTMENT | Freq: Two times a day (BID) | CUTANEOUS | 2 refills | Status: AC | PRN
  Filled 2024-09-13: qty 30, 15d supply, fill #0

## 2024-09-13 NOTE — Progress Notes (Signed)
 New Patient Visit   Subjective  Andrea Melton is a 68 y.o. female NEW PATIENT who presents for the following:  Total Body Skin Exam (TBSE)  Patient present today for new patient visit for TBSE.The patient reports she has spots, moles and lesions to be evaluated on her arms,face,back and base of finger nails, some may be new or changing and the patient may have concern these could be cancer. Patient has previously been treated by a dermatologist many years ago. Has never undergone a full body skin exam.   The following portions of the chart were reviewed this encounter and updated as appropriate: medications, allergies, medical history  Review of Systems:  No other skin or systemic complaints except as noted in HPI or Assessment and Plan.  Objective  Well appearing patient in no apparent distress; mood and affect are within normal limits.  A full examination was performed including scalp, head, eyes, ears, nose, lips, neck, chest, axillae, abdomen, back, buttocks, bilateral upper extremities, bilateral lower extremities, hands, feet, fingers, toes, fingernails, and toenails. All findings within normal limits unless otherwise noted below.     Relevant exam findings are noted in the Assessment and Plan.  Right Forearm - Anterior 0.6 cm pink scaly papule   Assessment & Plan   PRURITUS - ARMS  - likely related to Sk's - Rx for triamcinolone to use prn    LENTIGINES, SEBORRHEIC KERATOSES, HEMANGIOMAS - Benign normal skin lesions - Benign-appearing - Call for any changes  MELANOCYTIC NEVI - Tan-brown and/or pink-flesh-colored symmetric macules and papules - Benign appearing on exam today - Observation - Call clinic for new or changing moles - Recommend daily use of broad spectrum spf 30+ sunscreen to sun-exposed areas.   ACTINIC DAMAGE - Chronic condition, secondary to cumulative UV/sun exposure - diffuse scaly erythematous macules with underlying dyspigmentation -  Recommend daily broad spectrum sunscreen SPF 30+ to sun-exposed areas, reapply every 2 hours as needed.  - Staying in the shade or wearing long sleeves, sun glasses (UVA+UVB protection) and wide brim hats (4-inch brim around the entire circumference of the hat) are also recommended for sun protection.  - Call for new or changing lesions.  SKIN CANCER SCREENING PERFORMED TODAY NEOPLASM OF UNCERTAIN BEHAVIOR Right Forearm - Anterior Skin / nail biopsy Type of biopsy: tangential   Informed consent: discussed and consent obtained   Timeout: patient name, date of birth, surgical site, and procedure verified   Procedure prep:  Patient was prepped and draped in usual sterile fashion Prep type:  Isopropyl alcohol Anesthesia: the lesion was anesthetized in a standard fashion   Anesthetic:  1% lidocaine w/ epinephrine 1-100,000 buffered w/ 8.4% NaHCO3 Instrument used: flexible razor blade   Hemostasis achieved with: pressure, aluminum  chloride and electrodesiccation   Outcome: patient tolerated procedure well   Post-procedure details: sterile dressing applied and wound care instructions given   Dressing type: bandage and petrolatum    Specimen 1 - Surgical pathology Differential Diagnosis: SCC VS PRURIGO  Check Margins: No PRURITUS   Related Medications triamcinolone ointment (KENALOG) 0.1 % Apply 1 Application topically 2 (two) times daily as needed (Rash). LENTIGINES   SEBORRHEIC KERATOSIS   CHERRY ANGIOMA   ACTINIC SKIN DAMAGE   MULTIPLE BENIGN NEVI   SCREENING EXAM FOR SKIN CANCER    Return for PENDING PATHOLOGY.   I, Doyce Pan, CMA, am acting as scribe for Laronn Devonshire K, PA-C.  Documentation: I have reviewed the above documentation for accuracy and completeness, and I agree  with the above.  Devorah Givhan K, PA-C

## 2024-09-13 NOTE — Patient Instructions (Signed)

## 2024-09-15 LAB — SURGICAL PATHOLOGY

## 2024-09-20 ENCOUNTER — Ambulatory Visit: Payer: Self-pay | Admitting: Physician Assistant

## 2024-10-07 ENCOUNTER — Encounter: Payer: Self-pay | Admitting: Physician Assistant

## 2024-10-15 ENCOUNTER — Other Ambulatory Visit (HOSPITAL_BASED_OUTPATIENT_CLINIC_OR_DEPARTMENT_OTHER): Payer: Self-pay

## 2024-11-24 ENCOUNTER — Other Ambulatory Visit (HOSPITAL_COMMUNITY): Payer: Self-pay

## 2024-11-24 ENCOUNTER — Ambulatory Visit: Admitting: Dermatology

## 2024-11-24 ENCOUNTER — Encounter: Payer: Self-pay | Admitting: Dermatology

## 2024-11-24 ENCOUNTER — Encounter: Payer: Self-pay | Admitting: Family

## 2024-11-24 ENCOUNTER — Other Ambulatory Visit: Payer: Self-pay

## 2024-11-24 VITALS — BP 143/79 | HR 101 | Temp 97.7°F

## 2024-11-24 DIAGNOSIS — C44622 Squamous cell carcinoma of skin of right upper limb, including shoulder: Secondary | ICD-10-CM

## 2024-11-24 DIAGNOSIS — E119 Type 2 diabetes mellitus without complications: Secondary | ICD-10-CM

## 2024-11-24 NOTE — Patient Instructions (Signed)

## 2024-11-24 NOTE — Progress Notes (Signed)
 "  Follow-Up Visit   Subjective  Andrea Melton is a 69 y.o. female who presents for the following: Excision of well differentiated SCC with superficial infiltration of the right forearm, referred by Erminio Like, PA-C.  The following portions of the chart were reviewed this encounter and updated as appropriate: medications, allergies, medical history  Review of Systems:  No other skin or systemic complaints except as noted in HPI or Assessment and Plan.  Objective  Well appearing patient in no apparent distress; mood and affect are within normal limits.  A focused examination was performed of the following areas: arms Relevant physical exam findings are noted in the Assessment and Plan.   Right Forearm - Anterior Pink scaly plaque   Assessment & Plan   SCC (SQUAMOUS CELL CARCINOMA), ARM, RIGHT Right Forearm - Anterior - Skin excision  Excision method:  elliptical Lesion length (cm):  1.5 Lesion width (cm):  1.1 Margin per side (cm):  0.5 Total excision diameter (cm):  2.5 Informed consent: discussed and consent obtained   Timeout: patient name, date of birth, surgical site, and procedure verified   Procedure prep:  Patient was prepped and draped in usual sterile fashion Prep type:  Chlorhexidine  Anesthesia: the lesion was anesthetized in a standard fashion   Anesthetic:  1% lidocaine w/ epinephrine 1-100,000 buffered w/ 8.4% NaHCO3 Instrument used: #15 blade   Hemostasis achieved with: suture, pressure and electrodesiccation   Outcome: patient tolerated procedure well with no complications   Post-procedure details: sterile dressing applied and wound care instructions given   Dressing type: pressure dressing and bandage    - Skin repair Complexity:  Complex Final length (cm):  5.3 Informed consent: discussed and consent obtained   Timeout: patient name, date of birth, surgical site, and procedure verified   Procedure prep:  Patient was prepped and draped in usual  sterile fashion Prep type:  Chlorhexidine  Anesthesia: the lesion was anesthetized in a standard fashion   Anesthetic:  1% lidocaine w/ epinephrine 1-100,000 buffered w/ 8.4% NaHCO3 Reason for type of repair: reduce tension to allow closure   Undermining: area extensively undermined   Subcutaneous layers (deep stitches):  Suture size:  4-0 Suture type: Vicryl (polyglactin 910)   Stitches:  Buried vertical mattress Fine/surface layer approximation (top stitches):  Suture type: cyanoacrylate tissue glue   Hemostasis achieved with: suture, pressure and electrodesiccation Outcome: patient tolerated procedure well with no complications   Post-procedure details: sterile dressing applied and wound care instructions given   Dressing type: pressure dressing and bandage    Specimen 1 - Surgical pathology Differential Diagnosis: SCC vs other  Check Margins: No  The surgical wound was then cleaned, prepped, and re-anesthetized as above. Wound edges were undermined extensively along at least one entire edge and at a distance equal to or greater than the width of the defect (see wound defect size above) in order to achieve closure and decrease wound tension and anatomic distortion. Redundant tissue repair including standing cone removal was performed. Hemostasis was achieved with electrocautery. Subcutaneous and epidermal tissues were approximated with the above sutures. The surgical site was then lightly scrubbed with sterile, saline-soaked gauze. Steri-strips were applied, and the area was then bandaged using Vaseline ointment, non-adherent gauze, gauze pads, and tape to provide an adequate pressure dressing. The patient tolerated the procedure well, was given detailed written and verbal wound care instructions, and was discharged in good condition.   The patient will follow-up: PRN.  Return if symptoms worsen or fail to  improve.  I, Doyce Pan, CMA, am acting as scribe for RUFUS CHRISTELLA HOLY, MD.    Documentation: I have reviewed the above documentation for accuracy and completeness, and I agree with the above.  RUFUS CHRISTELLA HOLY, MD  "

## 2024-11-25 ENCOUNTER — Ambulatory Visit: Payer: Self-pay | Admitting: Dermatology

## 2024-11-25 ENCOUNTER — Other Ambulatory Visit (HOSPITAL_BASED_OUTPATIENT_CLINIC_OR_DEPARTMENT_OTHER): Payer: Self-pay

## 2024-11-25 ENCOUNTER — Other Ambulatory Visit: Payer: Self-pay

## 2024-11-25 LAB — SURGICAL PATHOLOGY

## 2024-11-25 MED ORDER — METFORMIN HCL 1000 MG PO TABS
1000.0000 mg | ORAL_TABLET | Freq: Two times a day (BID) | ORAL | 1 refills | Status: AC
  Filled 2024-11-25: qty 180, 90d supply, fill #0

## 2024-11-25 NOTE — Addendum Note (Signed)
 Addended by: DARYL SETTER on: 11/25/2024 11:32 AM   Modules accepted: Orders

## 2024-11-29 ENCOUNTER — Other Ambulatory Visit (HOSPITAL_BASED_OUTPATIENT_CLINIC_OR_DEPARTMENT_OTHER): Payer: Self-pay

## 2024-11-30 ENCOUNTER — Other Ambulatory Visit: Payer: Self-pay

## 2025-02-16 ENCOUNTER — Ambulatory Visit: Admitting: Family
